# Patient Record
Sex: Male | Born: 1975 | Race: White | Hispanic: Yes | Smoking: Current some day smoker
Health system: Southern US, Community
[De-identification: ages and names within clinical notes are randomized; demographics above are authoritative.]

## PROBLEM LIST (undated history)

## (undated) HISTORY — PX: NASAL FRACTURE SURGERY: SHX718

---

## 2017-04-22 ENCOUNTER — Encounter: Payer: Self-pay | Admitting: Emergency Medicine

## 2017-04-22 ENCOUNTER — Ambulatory Visit (INDEPENDENT_AMBULATORY_CARE_PROVIDER_SITE_OTHER): Payer: Self-pay

## 2017-04-22 ENCOUNTER — Ambulatory Visit
Admission: EM | Admit: 2017-04-22 | Discharge: 2017-04-22 | Disposition: A | Discharge status: Home / Self Care | Payer: Self-pay | Attending: Family Medicine | Admitting: Family Medicine

## 2017-04-22 ENCOUNTER — Other Ambulatory Visit: Payer: Self-pay

## 2017-04-22 DIAGNOSIS — W19XXXA Unspecified fall, initial encounter: Secondary | ICD-10-CM

## 2017-04-22 DIAGNOSIS — S7002XA Contusion of left hip, initial encounter: Secondary | ICD-10-CM

## 2017-04-22 DIAGNOSIS — M25552 Pain in left hip: Secondary | ICD-10-CM

## 2017-04-22 DIAGNOSIS — S63502A Unspecified sprain of left wrist, initial encounter: Secondary | ICD-10-CM

## 2017-04-22 DIAGNOSIS — M25532 Pain in left wrist: Secondary | ICD-10-CM

## 2017-04-22 DIAGNOSIS — S60222A Contusion of left hand, initial encounter: Secondary | ICD-10-CM

## 2017-04-22 MED ORDER — TRAMADOL HCL 50 MG PO TABS: 50.0000 mg | ORAL_TABLET | Freq: Four times a day (QID) | ORAL | 0 refills | Status: DC | PRN
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
## 2020-02-10 ENCOUNTER — Other Ambulatory Visit: Payer: Self-pay

## 2020-02-10 ENCOUNTER — Ambulatory Visit (INDEPENDENT_AMBULATORY_CARE_PROVIDER_SITE_OTHER): Payer: Self-pay

## 2020-02-10 ENCOUNTER — Ambulatory Visit
Admission: EM | Admit: 2020-02-10 | Discharge: 2020-02-10 | Disposition: A | Discharge status: Home / Self Care | Payer: Self-pay | Attending: Emergency Medicine | Admitting: Emergency Medicine

## 2020-02-10 DIAGNOSIS — M7711 Lateral epicondylitis, right elbow: Secondary | ICD-10-CM

## 2020-02-10 DIAGNOSIS — M25521 Pain in right elbow: Secondary | ICD-10-CM

## 2020-02-10 MED ORDER — METHYLPREDNISOLONE 4 MG PO TBPK: ORAL_TABLET | Freq: Every day | ORAL | 0 refills | Status: DC

## 2020-02-10 MED ORDER — IBUPROFEN 600 MG PO TABS: 600.0000 mg | ORAL_TABLET | Freq: Four times a day (QID) | ORAL | 0 refills | Status: DC | PRN

## 2020-02-10 NOTE — ED Triage Notes (Signed)
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 Having trouble holding a pain can. Started months ago but now getting much worse.

## 2020-02-10 NOTE — ED Provider Notes (Signed)
HPI  SUBJECTIVE:  Anthony Frank is a right-handed 44 y.o. male who presents with right lateral elbow pain that has become constant and daily over the past 2 months.  States that he has been getting this pain intermittently for some time, but usually resolves with several days of rest and NSAIDs.  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  He reports intermittent swelling, numbness tingling in his hand and grip weakness secondary to the pain.  States that he has been working more over the past 2 weeks.  He tried 600 to 800 mg of ibuprofen with improvement in symptoms.  He is also tried icy hot with improvement.  He has tried rest, copper sleeve, Velcro elbow support.  He has also tried cold compresses with worsening in his symptoms.  Symptoms are also worse with holding objects, lifting, grabbing, straightening his arm fully.  Past medical history none for diabetes, hypertension, smoking.  PMD: None.   History reviewed. No pertinent past medical history.  Past Surgical History:  Procedure Laterality Date  . NASAL FRACTURE SURGERY      History reviewed. No pertinent family history.  Social History   Tobacco Use  . Smoking status: Current Some Day Smoker    Types: Pipe  . Smokeless tobacco: Never Used  Vaping Use  . Vaping Use: Never used  Substance Use Topics  . Alcohol use: No  . Drug use: No    No current facility-administered medications for this encounter.  Current Outpatient Medications:  .  ibuprofen (ADVIL) 600 MG tablet, Take 1 tablet (600 mg total) by mouth every 6 (six) hours as needed., Disp: 30 tablet, Rfl: 0 .  methylPREDNISolone (MEDROL DOSEPAK) 4 MG TBPK tablet, Take by mouth daily. Follow package instructions, Disp: 21 tablet, Rfl: 0  No Known Allergies   ROS  As noted in HPI.   Physical Exam  BP 133/89   Pulse 71   Temp 98.3 F (36.8 C) (Oral)   Resp 16   Ht 5\' 8"  (1.727 m)   Wt 72.6 kg   SpO2 100%   BMI 24.33 kg/m    Constitutional: Well developed, well nourished, no acute distress Eyes:  EOMI, conjunctiva normal bilaterally HENT: Normocephalic, atraumatic,mucus membranes moist Respiratory: Normal inspiratory effort Cardiovascular: Normal rate GI: nondistended skin: No rash, skin intact Musculoskeletal: R Elbow ROM Normal for Pt. pain with full extension., Supracondylar region NT , Radial head NT, Olecrenon process NT , Medial epicondyle NT , Lateral epicondyle tender, Shoulder NT, Wrist NT, Hand NT with distal NVI CR<2secs, radial pulse intact, Sensation LT and Motor intact distally in distribution of radial, median, and ulnar nerve function. Neurologic: Alert & oriented x 3, no focal neuro deficits Psychiatric: Speech and behavior appropriate   ED Course   Medications - No data to display  Orders Placed This Encounter  Procedures  . DG Elbow Complete Right    Standing Status:   Standing    Number of Occurrences:   1    Order Specific Question:   Reason for Exam (SYMPTOM  OR DIAGNOSIS REQUIRED)    Answer:   pain    No results found for this or any previous visit (from the past 24 hour(s)). DG Elbow Complete Right  Result Date: 02/10/2020 CLINICAL DATA:  Pain in right elbow lobe.  No known injury. EXAM: RIGHT ELBOW - COMPLETE 3+ VIEW COMPARISON:  None. FINDINGS: There is no evidence of fracture, dislocation, or joint effusion.  There is no evidence of arthropathy or other focal bone abnormality. Soft tissues are unremarkable. IMPRESSION: Negative. Electronically Signed   By: Signa Kell M.D.   On: 02/10/2020 11:30    ED Clinical Impression  1. Lateral epicondylitis of right elbow      ED Assessment/Plan  Reviewed imaging independently.  Normal elbow.  See radiology report for full details.  Patient with a lateral epicondylitis right elbow.  Was sent home with Tylenol/ibuprofen, Medrol Dosepak, rest for 1 to 2 weeks.  He is not working until January 10.  If not better in 2 weeks of  conservative treatment, he needs to follow-up with [facility].  Discussed imaging, MDM, treatment plan, and plan for follow-up with patient. patient agrees with plan.   Meds ordered this encounter  Medications  . ibuprofen (ADVIL) 600 MG tablet    Sig: Take 1 tablet (600 mg total) by mouth every 6 (six) hours as needed.    Dispense:  30 tablet    Refill:  0  . methylPREDNISolone (MEDROL DOSEPAK) 4 MG TBPK tablet    Sig: Take by mouth daily. Follow package instructions    Dispense:  21 tablet    Refill:  0    *This clinic note was created using Dragon dictation software. Therefore, there may be occasional mistakes despite careful proofreading.   ?    Domenick Gong, MD 02/10/20 1301

## 2020-02-10 NOTE — Discharge Instructions (Addendum)
Your x-ray was normal.  Take 600 mg of ibuprofen combined with 1000 mg of Tylenol together 3-4 times a day as needed for pain.  The Medrol Dosepak will help with pain and swelling.  Continue your compressive sleeves.  Try heating pad on the area.  Rest it is much as you can.  Follow-up with orthopedics if you are not better in 2 weeks

## 2021-10-05 IMAGING — CR DG ELBOW COMPLETE 3+V*R*
4 series · 4 of 4 positions shown · non-contrast
Comparison: None.

CLINICAL DATA: Pain in right elbow lobe.  No known injury.

EXAM:
RIGHT ELBOW - COMPLETE 3+ VIEW

[elbow ap]
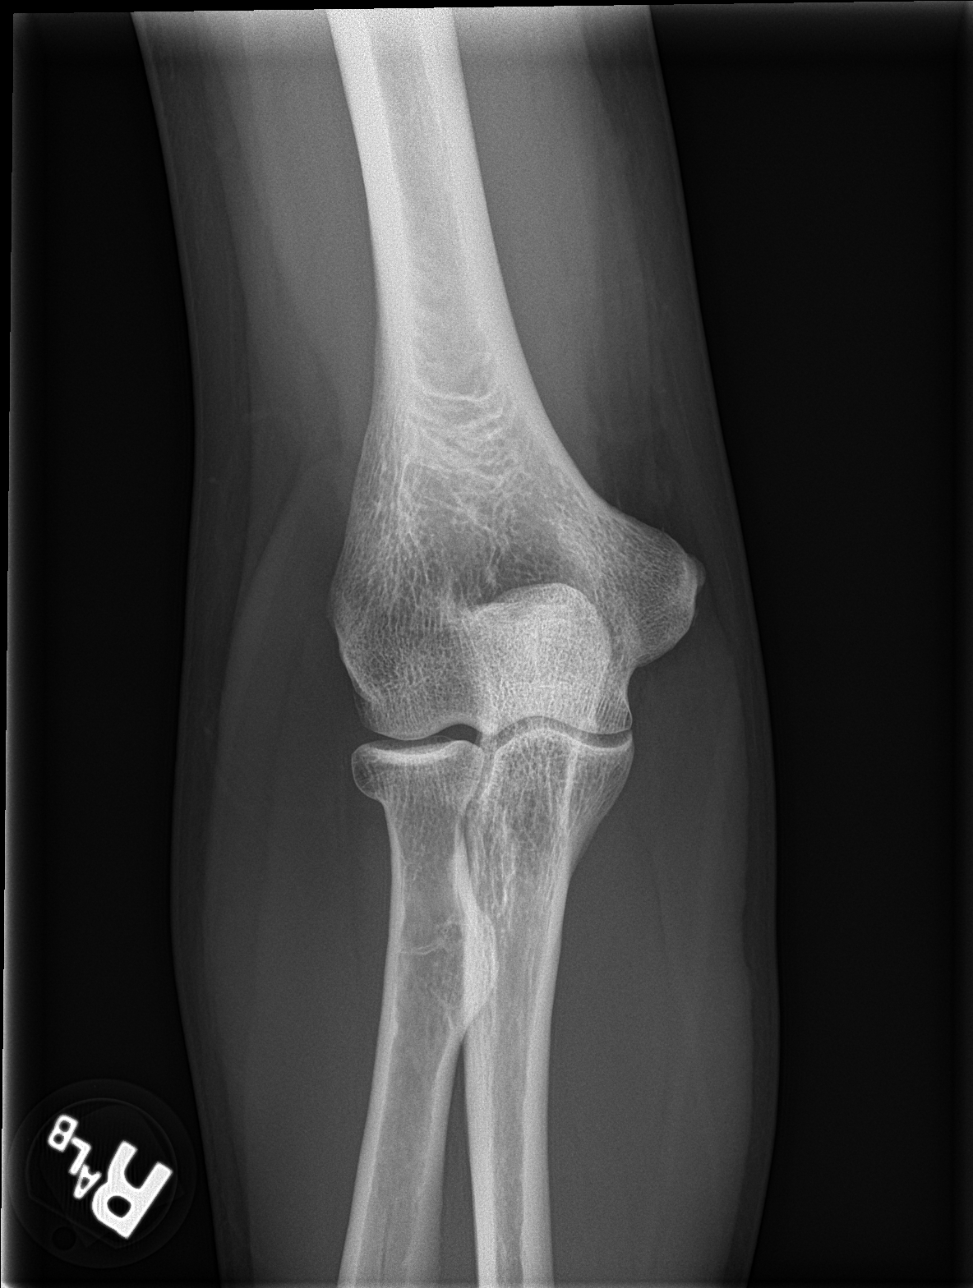

[elbow obl (1 of 2)]
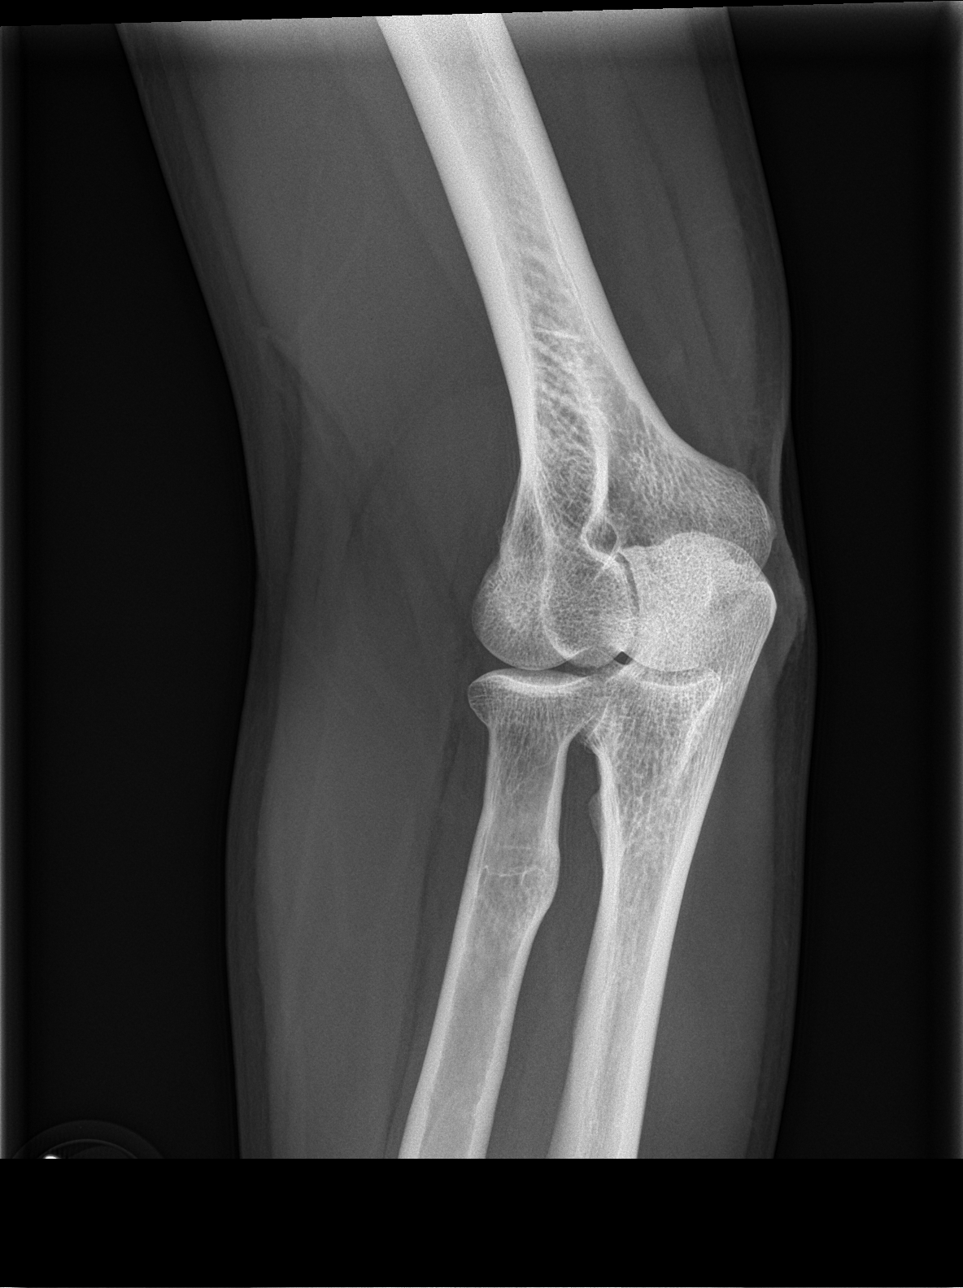

[elbow obl (2 of 2)]
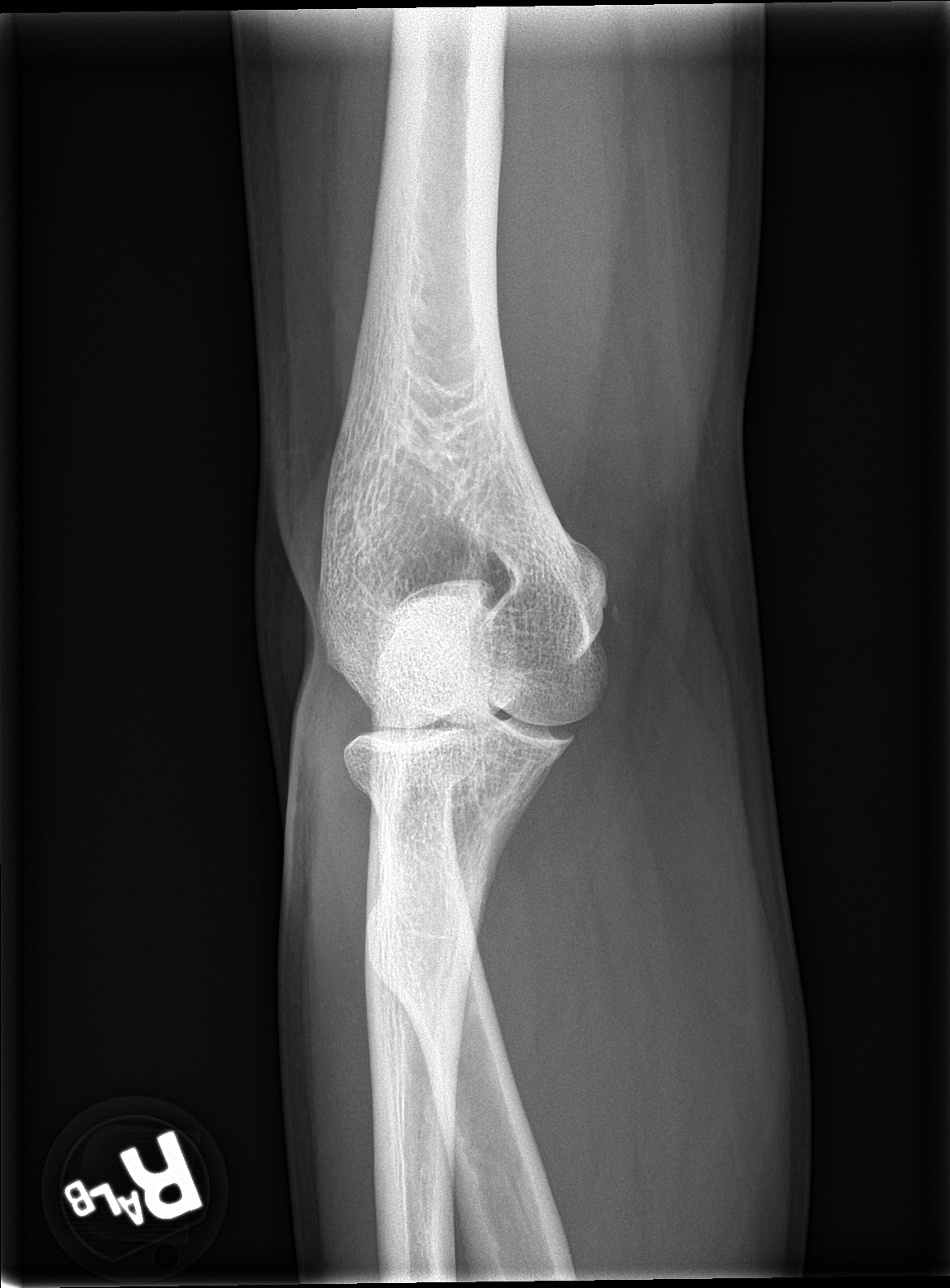

[elbow lat]
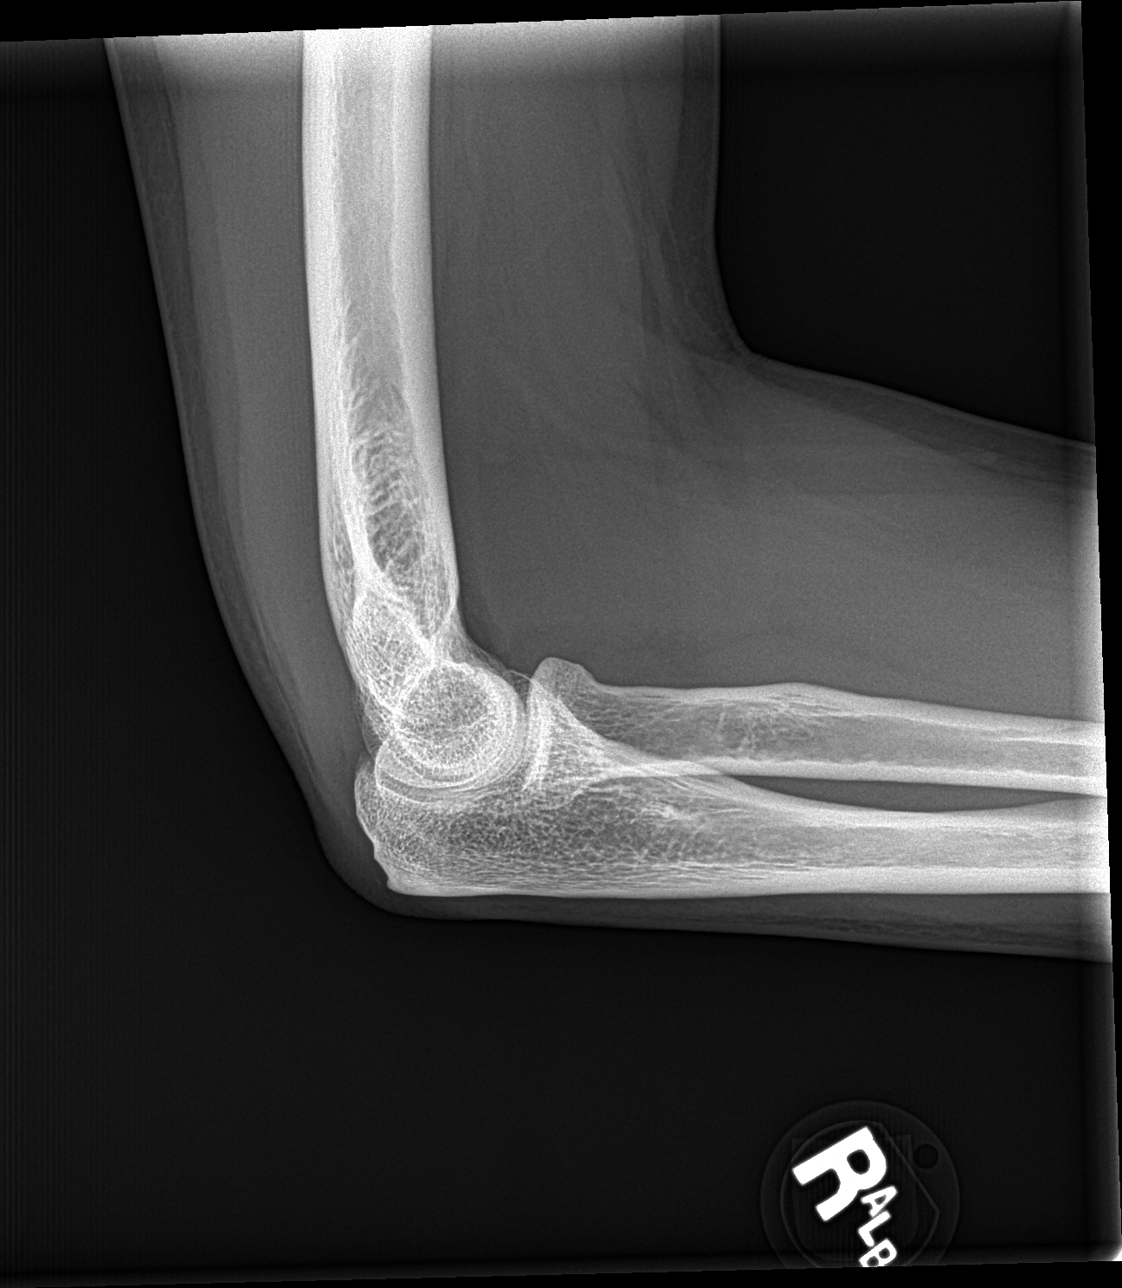

[4 of 4 positions shown; findings below may reference images not displayed]

FINDINGS: There is no evidence of fracture, dislocation, or joint effusion.
There is no evidence of arthropathy or other focal bone abnormality.
Soft tissues are unremarkable.
IMPRESSION: Negative.

## 2023-01-21 ENCOUNTER — Ambulatory Visit: Admission: EM | Admit: 2023-01-21 | Discharge: 2023-01-21 | Payer: Self-pay

## 2023-01-21 ENCOUNTER — Encounter: Payer: Self-pay | Admitting: Emergency Medicine

## 2023-01-21 DIAGNOSIS — F10939 Alcohol use, unspecified with withdrawal, unspecified: Secondary | ICD-10-CM

## 2023-01-21 DIAGNOSIS — F1090 Alcohol use, unspecified, uncomplicated: Secondary | ICD-10-CM

## 2023-01-21 NOTE — Discharge Instructions (Signed)
Go immediately to Er for further evaluation and treatment

## 2023-01-21 NOTE — ED Triage Notes (Addendum)
Pt has been drinking alcohol and not eating for several weeks. Today he feels shaky, has fatigue and dizziness.

## 2023-01-21 NOTE — ED Notes (Signed)
Patient is being discharged from the Urgent Care and sent to the Emergency Department via personal vehicle . Per Harl Bowie, NP, patient is in need of higher level of care due to alcohol withdrawal. Patient is aware and verbalizes understanding of plan of care.  Vitals:   01/21/23 1346  BP: 112/71  Pulse: (!) 110  Resp: (!) 22  Temp: 98.3 F (36.8 C)  SpO2: 100%

## 2023-01-21 NOTE — ED Provider Notes (Signed)
[facility]    CSN: [id number] Arrival date & time: 01/21/23  1317      History   Chief Complaint Chief Complaint  Patient presents with   Dizziness   Fatigue   excessive alcohol use     HPI Alejandro Ajello is a 47 y.o. male.   47 year old male, Whitney Reddinger, presents to urgent care for evaluation of alcohol problem, not eating for several weeks, feels shaky, fatigue, dizziness, depressed.  Patient denies homicidal or suicidal ideations. Pt is shaky, sweating, anxious,charge nurse Tiara at bedside.  The history is provided by the patient and the spouse.    History reviewed. No pertinent past medical history.  Patient Active Problem List   Diagnosis Date Noted   Alcohol withdrawal syndrome with complication (HCC) 01/21/2023   Alcohol drinking problem 01/21/2023    Past Surgical History:  Procedure Laterality Date   NASAL FRACTURE SURGERY         Home Medications    Prior to Admission medications   Medication Sig Start Date End Date Taking? Authorizing Provider  ibuprofen (ADVIL) 600 MG tablet Take 1 tablet (600 mg total) by mouth every 6 (six) hours as needed. 02/10/20   Domenick Gong, MD  methylPREDNISolone (MEDROL DOSEPAK) 4 MG TBPK tablet Take by mouth daily. Follow package instructions 02/10/20   Domenick Gong, MD    Family History No family history on file.  Social History Social History   Tobacco Use   Smoking status: Some Days    Types: Pipe   Smokeless tobacco: Never  Vaping Use   Vaping status: Every Day  Substance Use Topics   Alcohol use: Yes    Comment: Pt drinks beer and liquor   Drug use: No     Allergies   Patient has no known allergies.   Review of Systems Review of Systems  Constitutional:  Positive for appetite change and fatigue.  Neurological:  Positive for dizziness.  Psychiatric/Behavioral:  Negative for suicidal ideas. The patient is nervous/anxious.        Trying to quit drinking  alcohol  All other systems reviewed and are negative.    Physical Exam Triage Vital Signs ED Triage Vitals [01/21/23 1346]  Encounter Vitals Group     BP 112/71     Systolic BP Percentile      Diastolic BP Percentile      Pulse Rate (!) 110     Resp (!) 22     Temp 98.3 F (36.8 C)     Temp Source Oral     SpO2 100 %     Weight      Height      Head Circumference      Peak Flow      Pain Score      Pain Loc      Pain Education      Exclude from Growth Chart    No data found.  Updated Vital Signs BP 112/71 (BP Location: Left Arm)   Pulse (!) 110   Temp 98.3 F (36.8 C) (Oral)   Resp (!) 22   SpO2 100%   Visual Acuity Right Eye Distance:   Left Eye Distance:   Bilateral Distance:    Right Eye Near:   Left Eye Near:    Bilateral Near:     Physical Exam Vitals and nursing note reviewed.  Constitutional:      General: He is awake.     Appearance: He is ill-appearing.  HENT:     Head: Normocephalic.  Cardiovascular:     Rate and Rhythm: Tachycardia present.  Pulmonary:     Effort: Pulmonary effort is normal.  Neurological:     Mental Status: He is alert.  Psychiatric:        Behavior: Behavior is cooperative.      UC Treatments / Results  Labs (all labs ordered are listed, but only abnormal results are displayed) Labs Reviewed - No data to display  EKG   Radiology No results found.  Procedures Procedures (including critical care time)  Medications Ordered in UC Medications - No data to display  Initial Impression / Assessment and Plan / UC Course  I have reviewed the triage vital signs and the nursing notes.  Pertinent labs & imaging results that were available during my care of the patient were reviewed by me and considered in my medical decision making (see chart for details).  Discussed exam findings and plan of care with patient and significant other.  Referred patient to emergency room for further evaluation, spouse  will transport  patient.   Ddx: Alcohol abuse, DT's,depression Final Clinical Impressions(s) / UC Diagnoses   Final diagnoses:  Alcohol drinking problem     Discharge Instructions      Go immediately to Er for further evaluation and treatment    ED Prescriptions   None    PDMP not reviewed this encounter.   Clancy Gourd, NP 01/21/23 2008

## 2023-12-31 ENCOUNTER — Emergency Department: Payer: Self-pay

## 2023-12-31 ENCOUNTER — Inpatient Hospital Stay
Admission: EM | Admit: 2023-12-31 | Discharge: 2024-01-04 | DRG: 896 | Disposition: A | Discharge status: Home / Self Care | Payer: Self-pay | Attending: Emergency Medicine | Admitting: Emergency Medicine

## 2023-12-31 ENCOUNTER — Observation Stay: Payer: Self-pay

## 2023-12-31 DIAGNOSIS — N179 Acute kidney failure, unspecified: Secondary | ICD-10-CM | POA: Diagnosis present

## 2023-12-31 DIAGNOSIS — G928 Other toxic encephalopathy: Secondary | ICD-10-CM | POA: Diagnosis present

## 2023-12-31 DIAGNOSIS — F17290 Nicotine dependence, other tobacco product, uncomplicated: Secondary | ICD-10-CM | POA: Diagnosis present

## 2023-12-31 DIAGNOSIS — F419 Anxiety disorder, unspecified: Secondary | ICD-10-CM | POA: Diagnosis present

## 2023-12-31 DIAGNOSIS — D72829 Elevated white blood cell count, unspecified: Secondary | ICD-10-CM | POA: Diagnosis present

## 2023-12-31 DIAGNOSIS — R4182 Altered mental status, unspecified: Secondary | ICD-10-CM | POA: Diagnosis present

## 2023-12-31 DIAGNOSIS — J189 Pneumonia, unspecified organism: Secondary | ICD-10-CM

## 2023-12-31 DIAGNOSIS — R6510 Systemic inflammatory response syndrome (SIRS) of non-infectious origin without acute organ dysfunction: Secondary | ICD-10-CM | POA: Diagnosis present

## 2023-12-31 DIAGNOSIS — F10231 Alcohol dependence with withdrawal delirium: Principal | ICD-10-CM | POA: Diagnosis present

## 2023-12-31 DIAGNOSIS — F10931 Alcohol use, unspecified with withdrawal delirium: Secondary | ICD-10-CM | POA: Diagnosis present

## 2023-12-31 DIAGNOSIS — R569 Unspecified convulsions: Secondary | ICD-10-CM

## 2023-12-31 DIAGNOSIS — F10139 Alcohol abuse with withdrawal, unspecified: Secondary | ICD-10-CM

## 2023-12-31 DIAGNOSIS — F10939 Alcohol use, unspecified with withdrawal, unspecified: Secondary | ICD-10-CM

## 2023-12-31 DIAGNOSIS — R401 Stupor: Principal | ICD-10-CM

## 2023-12-31 DIAGNOSIS — Z79899 Other long term (current) drug therapy: Secondary | ICD-10-CM

## 2023-12-31 LAB — COMPREHENSIVE METABOLIC PANEL WITH GFR
ALT: 43 U/L (ref 0–44)
AST: 51 U/L — ABNORMAL HIGH (ref 15–41)
Albumin: 4.2 g/dL (ref 3.5–5.0)
Alkaline Phosphatase: 82 U/L (ref 38–126)
Anion gap: 16 — ABNORMAL HIGH (ref 5–15)
BUN: 13 mg/dL (ref 6–20)
CO2: 21 mmol/L — ABNORMAL LOW (ref 22–32)
Calcium: 8.2 mg/dL — ABNORMAL LOW (ref 8.9–10.3)
Chloride: 104 mmol/L (ref 98–111)
Creatinine, Ser: 1.59 mg/dL — ABNORMAL HIGH (ref 0.61–1.24)
GFR, Estimated: 53 mL/min — ABNORMAL LOW (ref >60)
Glucose, Bld: 135 mg/dL — ABNORMAL HIGH (ref 70–99)
Potassium: 3.8 mmol/L (ref 3.5–5.1)
Sodium: 141 mmol/L (ref 135–145)
Total Bilirubin: 0.4 mg/dL (ref 0.0–1.2)
Total Protein: 6.9 g/dL (ref 6.5–8.1)

## 2023-12-31 LAB — URINALYSIS, COMPLETE (UACMP) WITH MICROSCOPIC
Bilirubin Urine: NEGATIVE
Glucose, UA: NEGATIVE mg/dL
Hgb urine dipstick: NEGATIVE
Ketones, ur: 20 mg/dL — AB
Leukocytes,Ua: NEGATIVE
Nitrite: NEGATIVE
Protein, ur: NEGATIVE mg/dL
Specific Gravity, Urine: 1.021 (ref 1.005–1.030)
Squamous Epithelial / HPF: 0 /HPF (ref 0–5)
pH: 7 (ref 5.0–8.0)

## 2023-12-31 LAB — CBC
HCT: 48.7 % (ref 39.0–52.0)
Hemoglobin: 17.2 g/dL — ABNORMAL HIGH (ref 13.0–17.0)
MCH: 31.2 pg (ref 26.0–34.0)
MCHC: 35.3 g/dL (ref 30.0–36.0)
MCV: 88.2 fL (ref 80.0–100.0)
Platelets: 230 K/uL (ref 150–400)
RBC: 5.52 MIL/uL (ref 4.22–5.81)
RDW: 14.6 % (ref 11.5–15.5)
WBC: 15.4 K/uL — ABNORMAL HIGH (ref 4.0–10.5)
nRBC: 0 % (ref 0.0–0.2)

## 2023-12-31 LAB — VITAMIN B12: Vitamin B-12: 1040 pg/mL — ABNORMAL HIGH (ref 180–914)

## 2023-12-31 LAB — HIV ANTIBODY (ROUTINE TESTING W REFLEX): HIV Screen 4th Generation wRfx: NONREACTIVE

## 2023-12-31 LAB — LIPASE, BLOOD: Lipase: 115 U/L — ABNORMAL HIGH (ref 11–51)

## 2023-12-31 LAB — URINE DRUG SCREEN
Amphetamines: NEGATIVE
Barbiturates: NEGATIVE
Benzodiazepines: POSITIVE — AB
Cocaine: NEGATIVE
Fentanyl: NEGATIVE
Methadone Scn, Ur: NEGATIVE
Opiates: NEGATIVE
Tetrahydrocannabinol: NEGATIVE

## 2023-12-31 LAB — T4, FREE: Free T4: 1.1 ng/dL (ref 0.61–1.12)

## 2023-12-31 LAB — ETHANOL: Alcohol, Ethyl (B): <15 mg/dL (ref <15)

## 2023-12-31 LAB — TSH: TSH: 0.27 u[IU]/mL — ABNORMAL LOW (ref 0.350–4.500)

## 2023-12-31 MED ORDER — LORAZEPAM 2 MG PO TABS
0.0000 mg | ORAL_TABLET | Freq: Four times a day (QID) | ORAL | Status: AC
  Administered 2024-01-01: 2 mg via ORAL
  Administered 2024-01-01: 1 mg via ORAL
  Filled 2023-12-31 (×2): qty 1

## 2023-12-31 MED ORDER — THIAMINE HCL 100 MG/ML IJ SOLN
500.0000 mg | Freq: Three times a day (TID) | INTRAVENOUS | Status: DC
  Filled 2023-12-31 (×2): qty 5

## 2023-12-31 MED ORDER — LORAZEPAM 2 MG/ML IJ SOLN: 0.0000 mg | Freq: Two times a day (BID) | INTRAMUSCULAR | Status: AC

## 2023-12-31 MED ORDER — LORAZEPAM 2 MG/ML IJ SOLN
2.0000 mg | Freq: Four times a day (QID) | INTRAMUSCULAR | Status: DC | PRN
  Filled 2023-12-31: qty 1

## 2023-12-31 MED ORDER — ADULT MULTIVITAMIN W/MINERALS CH
1.0000 | ORAL_TABLET | Freq: Every day | ORAL | Status: DC
  Administered 2023-12-31 – 2024-01-04 (×5): 1 via ORAL
  Filled 2023-12-31 (×5): qty 1

## 2023-12-31 MED ORDER — ACETAMINOPHEN 325 MG PO TABS
650.0000 mg | ORAL_TABLET | Freq: Four times a day (QID) | ORAL | Status: DC | PRN
  Filled 2023-12-31: qty 2

## 2023-12-31 MED ORDER — THIAMINE HCL 100 MG/ML IJ SOLN: 100.0000 mg | Freq: Every day | INTRAMUSCULAR | Status: DC

## 2023-12-31 MED ORDER — THIAMINE HCL 100 MG/ML IJ SOLN
500.0000 mg | Freq: Three times a day (TID) | INTRAVENOUS | Status: AC
  Administered 2023-12-31 – 2024-01-02 (×6): 500 mg via INTRAVENOUS
  Filled 2023-12-31 (×6): qty 5

## 2023-12-31 MED ORDER — LORAZEPAM 2 MG/ML IJ SOLN
0.0000 mg | Freq: Four times a day (QID) | INTRAMUSCULAR | Status: AC
  Administered 2023-12-31 (×3): 2 mg via INTRAVENOUS
  Administered 2024-01-01: 1 mg via INTRAVENOUS
  Administered 2024-01-01: 2 mg via INTRAVENOUS
  Administered 2024-01-01: 1 mg via INTRAVENOUS
  Filled 2023-12-31 (×6): qty 1

## 2023-12-31 MED ORDER — ONDANSETRON HCL 4 MG/2ML IJ SOLN: 4.0000 mg | Freq: Four times a day (QID) | INTRAMUSCULAR | Status: DC | PRN

## 2023-12-31 MED ORDER — FOLIC ACID 1 MG PO TABS
1.0000 mg | ORAL_TABLET | Freq: Every day | ORAL | Status: DC
  Administered 2023-12-31 – 2024-01-04 (×5): 1 mg via ORAL
  Filled 2023-12-31 (×5): qty 1

## 2023-12-31 MED ORDER — LORAZEPAM 2 MG/ML IJ SOLN
2.0000 mg | Freq: Once | INTRAMUSCULAR | Status: AC
Start: 2023-12-31 — End: 2024-01-03
  Administered 2024-01-03: 2 mg via INTRAVENOUS
  Filled 2023-12-31: qty 1

## 2023-12-31 MED ORDER — THIAMINE HCL 100 MG/ML IJ SOLN
250.0000 mg | Freq: Every day | INTRAVENOUS | Status: DC
  Administered 2024-01-02 – 2024-01-04 (×3): 250 mg via INTRAVENOUS
  Filled 2023-12-31 (×3): qty 2.5

## 2023-12-31 MED ORDER — LORAZEPAM 2 MG PO TABS
0.0000 mg | ORAL_TABLET | Freq: Two times a day (BID) | ORAL | Status: AC
  Administered 2024-01-02: 1 mg via ORAL
  Filled 2023-12-31 (×2): qty 1

## 2023-12-31 MED ORDER — SODIUM CHLORIDE 0.9 % IV BOLUS
1000.0000 mL | Freq: Once | INTRAVENOUS | Status: AC
  Administered 2023-12-31: 1000 mL via INTRAVENOUS

## 2023-12-31 MED ORDER — ENOXAPARIN SODIUM 40 MG/0.4ML IJ SOSY
40.0000 mg | PREFILLED_SYRINGE | INTRAMUSCULAR | Status: DC
  Administered 2023-12-31 – 2024-01-03 (×4): 40 mg via SUBCUTANEOUS
  Filled 2023-12-31 (×4): qty 0.4

## 2023-12-31 MED ORDER — THIAMINE MONONITRATE 100 MG PO TABS: 100.0000 mg | ORAL_TABLET | Freq: Every day | ORAL | Status: DC

## 2023-12-31 NOTE — ED Provider Notes (Addendum)
 [facility] Provider Note    Event Date/Time   First MD Initiated Contact with Patient 12/31/23 0403     (approximate)   History   Chief Complaint: Altered Mental Status   HPI  Anthony Frank is a 48 y.o. male with history of alcohol dependence who is brought to the ED by EMS due to altered mental status.  He is somnolent on arrival.  Not able to engage with questioning in English or with assistance from Spanish interpreter.  Denies pain.    Family (wife and cousin) at bedside notes that patient has been in this state for the past 3 days, waxing and waning.  They suspect it is due to relapse of alcohol abuse.  Patient has a history of alcohol dependence, has been in remission for a while, and then following a vacation to [place] where the patient is from, he started drinking heavily again.  Family members note that they suspect he sneaks alcohol frequently in between work and coming home.  They have found alcohol packages in his car.        No past medical history on file.  Current Outpatient Rx   Order #: [id number] Class: Normal   Order #: [id number] Class: Normal    Past Surgical History:  Procedure Laterality Date   NASAL FRACTURE SURGERY      Physical Exam   Triage Vital Signs: ED Triage Vitals  Encounter Vitals Group     BP      Girls Systolic BP Percentile      Girls Diastolic BP Percentile      Boys Systolic BP Percentile      Boys Diastolic BP Percentile      Pulse      Resp      Temp      Temp src      SpO2      Weight      Height      Head Circumference      Peak Flow      Pain Score      Pain Loc      Pain Education      Exclude from Growth Chart     Most recent vital signs: Vitals:   12/31/23 0419 12/31/23 0430  BP:  137/78  Pulse:  (!) 106  Resp:  14  Temp: 98.3 F (36.8 C)   SpO2:  98%    General: Awake, no distress.  Stuporous and somnolent.  Yawning. CV:  Good peripheral perfusion.  Tachycardia  heart rate 105.  Normal distal pulses.  Regular rhythm. Resp:  Normal effort..  Auscultation bilaterally Abd:  No distention.  Soft nontender Other:  No pallor or icterus.  No signs of head trauma or other injuries.   ED Results / Procedures / Treatments   Labs (all labs ordered are listed, but only abnormal results are displayed) Labs Reviewed  COMPREHENSIVE METABOLIC PANEL WITH GFR - Abnormal; Notable for the following components:      Result Value   CO2 21 (*)    Glucose, Bld 135 (*)    Creatinine, Ser 1.59 (*)    Calcium 8.2 (*)    AST 51 (*)    GFR, Estimated 53 (*)    Anion gap 16 (*)    All other components within normal limits  CBC - Abnormal; Notable for the following components:   WBC 15.4 (*)    Hemoglobin 17.2 (*)    All other components within  normal limits  LIPASE, BLOOD - Abnormal; Notable for the following components:   Lipase 115 (*)    All other components within normal limits  ETHANOL  URINALYSIS, ROUTINE W REFLEX MICROSCOPIC  URINE DRUG SCREEN  CBG MONITORING, ED     EKG Interpreted by me Sinus tachycardia rate 110.  Right axis, normal intervals.  Normal QRS ST segments and T waves.   RADIOLOGY CT head interpreted by me, unremarkable.  Radiology report reviewed.   PROCEDURES:  .Critical Care  Performed by: Viviann Pastor, MD Authorized by: Viviann Pastor, MD   Critical care provider statement:    Critical care was necessary to treat or prevent imminent or life-threatening deterioration of the following conditions:  CNS failure or compromise    MEDICATIONS ORDERED IN ED: Medications  LORazepam (ATIVAN) injection 0-4 mg (has no administration in time range)    Or  LORazepam (ATIVAN) tablet 0-4 mg (has no administration in time range)  LORazepam (ATIVAN) injection 0-4 mg (has no administration in time range)    Or  LORazepam (ATIVAN) tablet 0-4 mg (has no administration in time range)  thiamine (VITAMIN B1) tablet 100 mg (has no  administration in time range)    Or  thiamine (VITAMIN B1) injection 100 mg (has no administration in time range)  LORazepam (ATIVAN) injection 2 mg (has no administration in time range)  sodium chloride 0.9 % bolus 1,000 mL (1,000 mLs Intravenous New Bag/Given 12/31/23 0445)     IMPRESSION / MDM / ASSESSMENT AND PLAN / ED COURSE  I reviewed the triage vital signs and the nursing notes.  DDx: Alcohol intoxication, electrolyte derangement, metabolic acidosis, AKI, intracranial hemorrhage  Patient's presentation is most consistent with acute presentation with potential threat to life or bodily function.  Patient presents with altered mental status, stuporous and somnolent.  Appears somewhat dehydrated, and has sinus tachycardia with otherwise normal vital signs.  No signs of trauma.  No focal exam findings.  No focal neurologic deficits to suggest stroke, other than aphasia.  Symptoms have been going on for a few days, outside of any interventional window.  ----------------------------------------- 6:10 AM on 12/31/2023 ----------------------------------------- Labs showed leukocytosis, mild metabolic acidosis with anion gap elevation.  Lipase of 115.  Alcohol level negative.  CT head unremarkable.   Persistent tachycardia with heart rate of 110 despite IV fluids.  Also has facial flushing.  Will need to admit for altered mental status.  Will start CIWA protocol and IV Ativan for possible complicated alcohol withdrawal.       FINAL CLINICAL IMPRESSION(S) / ED DIAGNOSES   Final diagnoses:  Stupor  Alcohol withdrawal syndrome with complication (HCC)     Rx / DC Orders   ED Discharge Orders     None        Note:  This document was prepared using Dragon voice recognition software and may include unintentional dictation errors.   Viviann Pastor, MD 12/31/23 9382    Viviann Pastor, MD 12/31/23 9380    Viviann Pastor, MD 12/31/23 [phone]

## 2023-12-31 NOTE — ED Notes (Addendum)
 Patient placed on 2 L [place] due to O2 saturation dropping to mid 80's.

## 2023-12-31 NOTE — ED Notes (Signed)
 Patient transported to MRI

## 2023-12-31 NOTE — Procedures (Signed)
 Patient Name: Anthony Frank  MRN: [id number]  Epilepsy Attending: Arlin MALVA Krebs  Referring Physician/Provider: Laurita Cort DASEN, MD  Date: 12/31/2023 Duration: 26.22 mins  Patient history: 48yo M with ams. EEG to evaluate for seizure  Level of alertness: Awake, asleep  AEDs during EEG study: Ativan  Technical aspects: This EEG study was done with scalp electrodes positioned according to the 10-20 International system of electrode placement. Electrical activity was reviewed with band pass filter of 1-70Hz , sensitivity of 7 uV/mm, display speed of 42mm/sec with a 60Hz  notched filter applied as appropriate. EEG data were recorded continuously and digitally stored.  Video monitoring was available and reviewed as appropriate.  Description: The posterior dominant rhythm consists of 9-10 Hz activity of moderate voltage (25-35 uV) seen predominantly in posterior head regions, symmetric and reactive to eye opening and eye closing. Sleep was characterized by vertex waves, sleep spindles (12 to 14 Hz), maximal frontocentral region. There is an excessive amount of 15 to 18 Hz beta activity distributed symmetrically and diffusely. EEG showed intermittent generalized 3 to 6 Hz theta-delta slowing. Hyperventilation and photic stimulation were not performed.     ABNORMALITY - Intermittent slow, generalized - Excessive beta, generalized  IMPRESSION: This study is suggestive of mild generalized cerebral dysfunction (encephalopathy). The excessive beta activity seen in the background is most likely due to the effect of benzodiazepine and is a benign EEG pattern. No seizures or epileptiform discharges were seen throughout the recording.  Jamita Mckelvin O Thelia Tanksley  ]

## 2023-12-31 NOTE — Progress Notes (Signed)
 Eeg done

## 2023-12-31 NOTE — H&P (Signed)
 History and Physical    Sunny Gains FMW:[id number] DOB: 16-Jul-1975 DOA: 12/31/2023  PCP: Pcp, No (Confirm with patient/family/NH records and if not entered, this has to be entered at [facility] point of entry) Patient coming from: Home  I have personally briefly reviewed patient's old medical records in [facility]  Chief Complaint: Patient is confused.  HPI: Anthony Frank is a 48 y.o. male with medical history significant of alcohol abuse, brought in by family member for evaluation of altered mentations.  Family at bedside gave history.  Patient is unknown binge drinker, he traveled to [place] for the last week and came back on Friday.  On same day, wife started noticed patient  must be drunk as his gait is unsteady.  In the next 2 days, wife was at work and did not attend for him.  By Monday, patient was found to be more confused and unsteady, and complaining about feeling nausea.  Yesterday, mentation now improving, and patient became more sleepy and more confused.  Family became more concerned and brought him to ED last night.  Patient complained about headache, and feeling nausea but denied any weakness numbness of any of the limbs.  No neck pain, no fever or chills.  No urinary symptoms or diarrhea. ED Course: Afebrile, tachycardia heart rate 100-1 20s, blood pressure 130/70 O2 saturation 100% room air.  Blood work showed negative alcohol level, WBC 15.1 hemoglobin 17.2, BUN 13 creatinine 1.5.  Patient was given 1 dose of 2 mg of Ativan and became obtunded  Review of Systems: Unable to perform, patient is confused.  No past medical history on file.  Past Surgical History:  Procedure Laterality Date   NASAL FRACTURE SURGERY       reports that he has been smoking pipe. He has never used smokeless tobacco. He reports current alcohol use. He reports that he does not use drugs.  No Known Allergies  No family history on file.   Prior to Admission medications    Not on File    Physical Exam: Vitals:   12/31/23 0835 12/31/23 0900 12/31/23 0930 12/31/23 1000  BP:  122/65 129/77 131/77  Pulse:  (!) 101 (!) 109 (!) 122  Resp:  18 19 20   Temp: 98.6 F (37 C)     TempSrc: Oral     SpO2:  98% 99% 100%  Weight:  71 kg    Height:  5' 9 (1.753 m)      Constitutional: NAD, calm, comfortable Vitals:   12/31/23 0835 12/31/23 0900 12/31/23 0930 12/31/23 1000  BP:  122/65 129/77 131/77  Pulse:  (!) 101 (!) 109 (!) 122  Resp:  18 19 20   Temp: 98.6 F (37 C)     TempSrc: Oral     SpO2:  98% 99% 100%  Weight:  71 kg    Height:  5' 9 (1.753 m)     Eyes: PERRL, lids and conjunctivae normal ENMT: Mucous membranes are moist. Posterior pharynx clear of any exudate or lesions.Normal dentition.  Neck: normal, supple, no masses, no thyromegaly Respiratory: clear to auscultation bilaterally, no wheezing, no crackles. Normal respiratory effort. No accessory muscle use.  Cardiovascular: Regular rate and rhythm, no murmurs / rubs / gallops. No extremity edema. 2+ pedal pulses. No carotid bruits.  Abdomen: no tenderness, no masses palpated. No hepatosplenomegaly. Bowel sounds positive.  Musculoskeletal: no clubbing / cyanosis. No joint deformity upper and lower extremities. Good ROM, no contractures. Normal muscle tone.  Skin: no  rashes, lesions, ulcers. No induration Neurologic: CN 2-12 grossly intact. Sensation intact, DTR normal. Strength 5/5 in all 4.  Psychiatric: Arousable, confused.  Labs on Admission: I have personally reviewed following labs and imaging studies  CBC: Recent Labs  Lab 12/31/23 0420  WBC 15.4*  HGB 17.2*  HCT 48.7  MCV 88.2  PLT 230   Basic Metabolic Panel: Recent Labs  Lab 12/31/23 0420  NA 141  K 3.8  CL 104  CO2 21*  GLUCOSE 135*  BUN 13  CREATININE 1.59*  CALCIUM 8.2*   GFR: Estimated Creatinine Clearance: 56.8 mL/min (A) (by C-G formula based on SCr of 1.59 mg/dL (H)). Liver Function Tests: Recent Labs   Lab 12/31/23 0420  AST 51*  ALT 43  ALKPHOS 82  BILITOT 0.4  PROT 6.9  ALBUMIN 4.2   Recent Labs  Lab 12/31/23 0420  LIPASE 115*   No results for input(s): AMMONIA in the last 168 hours. Coagulation Profile: No results for input(s): INR, PROTIME in the last 168 hours. Cardiac Enzymes: No results for input(s): CKTOTAL, CKMB, CKMBINDEX, TROPONINI in the last 168 hours. BNP (last 3 results) No results for input(s): PROBNP in the last 8760 hours. HbA1C: No results for input(s): HGBA1C in the last 72 hours. CBG: No results for input(s): GLUCAP in the last 168 hours. Lipid Profile: No results for input(s): CHOL, HDL, LDLCALC, TRIG, CHOLHDL, LDLDIRECT in the last 72 hours. Thyroid Function Tests: Recent Labs    12/31/23 0420  TSH 0.270*   Anemia Panel: No results for input(s): VITAMINB12, FOLATE, FERRITIN, TIBC, IRON, RETICCTPCT in the last 72 hours. Urine analysis: No results found for: COLORURINE, APPEARANCEUR, LABSPEC, PHURINE, GLUCOSEU, HGBUR, BILIRUBINUR, KETONESUR, PROTEINUR, UROBILINOGEN, NITRITE, LEUKOCYTESUR  Radiological Exams on Admission: DG Chest 1 View Result Date: 12/31/2023 CLINICAL DATA:  Altered mental status. EXAM: CHEST  1 VIEW COMPARISON:  None. FINDINGS: Lungs are hypoinflated without lobar consolidation or effusion. Triangular opacity over the right lower lung which may be artifactual. Cardiomediastinal silhouette and remainder of the exam is unremarkable. IMPRESSION: 1. Hypoinflation without acute cardiopulmonary disease. 2. Triangular opacity over the right lower lung which may be artifactual. Recommend repeat PA and lateral chest x-ray. Electronically Signed   By: Toribio Agreste M.D.   On: 12/31/2023 09:36   CT HEAD WO CONTRAST Result Date: 12/31/2023 CLINICAL DATA:  Altered mental status. EXAM: CT HEAD WITHOUT CONTRAST TECHNIQUE: Contiguous axial images were obtained from the base of  the skull through the vertex without intravenous contrast. RADIATION DOSE REDUCTION: This exam was performed according to the departmental dose-optimization program which includes automated exposure control, adjustment of the mA and/or kV according to patient size and/or use of iterative reconstruction technique. COMPARISON:  No comparison studies available. FINDINGS: Brain: There is no evidence for acute hemorrhage, hydrocephalus, mass lesion, or abnormal extra-axial fluid collection. No definite CT evidence for acute infarction. Vascular: No hyperdense vessel or unexpected calcification. Skull: No evidence for fracture. No worrisome lytic or sclerotic lesion. Sinuses/Orbits: The visualized paranasal sinuses and mastoid air cells are clear. Visualized portions of the globes and intraorbital fat are unremarkable. Other: None. IMPRESSION: No acute intracranial abnormality. Electronically Signed   By: Camellia Candle M.D.   On: 12/31/2023 05:50    EKG: Independently reviewed.  Sinus tachycardia, no acute ST changes.  Assessment/Plan Principal Problem:   Acute hyperactive alcohol withdrawal delirium (HCC) Active Problems:   AMS (altered mental status)  (please populate well all problems here in Problem List. (For example, if patient  is on BP meds at home and you resume or decide to hold them, it is a problem that needs to be her. Same for CAD, COPD, HLD and so on)  Acute metabolic encephalopathy Alcohol withdrawal - Case was discussed with neurology at bedside. - Continue to look for metabolic etiology, agreed with checking B12, ammonia, TSH level - EEG and brain MRI - Agreed with neurology about high-dose thiamine for possible Warnicke encephalopathy -CIWA  SIRS - Leukocytosis and tachycardia, no significant infection source found.  Clinically patient does not have any meningeal sign, low suspicion for CNS infection.   - Check UA and chest x-ray, monitor off antibiotics.  DVT prophylaxis:  Lovenox Code Status: Full code Family Communication: Wife at bedside Disposition Plan: Expect less than 2 midnight hospital stay Consults called: Neurology Admission status: PCU observation   Cort ONEIDA Mana MD [facility] Pager [phone]  12/31/2023, 10:57 AM

## 2023-12-31 NOTE — Consult Note (Signed)
 NEUROLOGY CONSULT NOTE   Date of service: December 31, 2023 Patient Name: Anthony Frank MRN:  [id number] DOB:  08-Jan-1976 Chief Complaint: Altered mental status Requesting Provider: Laurita Cort DASEN, MD  History of Present Illness  Anthony Frank is a 48 y.o. male with significant past history other than alcohol abuse, who recently came back from a trip to [place], and according to wife has had excessive alcohol consumption during that trip and also upon returning.  He consumes alcohol in his car to hide from his wife because she does not approve of it.  She noticed that he was very sleepy on Saturday and was not feeling well.  On Sunday he woke up and did some work but over the last day or so has been more drowsy and not acting like himself.  According to her, he probably has not had much to drink in the last couple of days but she is not sure when his last drink was. Patient was admitted to the hospitalist for further evaluation.  According to the hospitalist this morning, when he examined the patient, the patient was extremely drowsy.  He had received IV Ativan. The hospitalist was concerned that the patient has an underlying neurological process for which he consulted me. I saw and evaluated the patient with the hospitalist, with the wife at bedside provide history. Patient was able to tell me that he did not have any fevers or sickness while he was traveling to [place]. Wife also does not report any febrile illness Noted to have AKI in the ER. Also has leukocytosis.   ROS  Unable to reliably ascertain due to his altered mentation  Past History  No past medical history on file.  Past Surgical History:  Procedure Laterality Date   NASAL FRACTURE SURGERY      Family History: No family history on file.  Social History  reports that he has been smoking pipe. He has never used smokeless tobacco. He reports current alcohol use. He reports that he does not use  drugs.  No Known Allergies  Medications   Current Facility-Administered Medications:    acetaminophen (TYLENOL) tablet 650 mg, 650 mg, Oral, Q6H PRN, Zhang, Ping T, MD   enoxaparin (LOVENOX) injection 40 mg, 40 mg, Subcutaneous, Q24H, Zhang, Ping T, MD   LORazepam (ATIVAN) injection 0-4 mg, 0-4 mg, Intravenous, Q6H, 2 mg at 12/31/23 0703 **OR** LORazepam (ATIVAN) tablet 0-4 mg, 0-4 mg, Oral, Q6H, Viviann Pastor, MD   NOREEN ON 01/02/2024] LORazepam (ATIVAN) injection 0-4 mg, 0-4 mg, Intravenous, Q12H **OR** [START ON 01/02/2024] LORazepam (ATIVAN) tablet 0-4 mg, 0-4 mg, Oral, Q12H, Viviann Pastor, MD   LORazepam (ATIVAN) injection 2 mg, 2 mg, Intravenous, Once, Viviann Pastor, MD   LORazepam (ATIVAN) injection 2 mg, 2 mg, Intravenous, Q6H PRN, Zhang, Ping T, MD   ondansetron (ZOFRAN) injection 4 mg, 4 mg, Intravenous, Q6H PRN, Zhang, Ping T, MD   thiamine (VITAMIN B1) 500 mg in sodium chloride 0.9 % 50 mL IVPB, 500 mg, Intravenous, TID, Laurita Cort DASEN, MD No current outpatient medications on file.  Vitals   Vitals:   12/31/23 0705 12/31/23 0830 12/31/23 0835 12/31/23 0900  BP: 115/64 (!) 118/59    Pulse: (!) 112 (!) 102    Resp:      Temp:   98.6 F (37 C)   TempSrc:   Oral   SpO2:      Weight:    71 kg  Height:    5' 9 (1.753  m)    Body mass index is 23.11 kg/m.   Physical Exam   General: Drowsy, sleeping in bed, awakens to voice.  Appears flushed HEENT: Normocephalic atraumatic CVS: Regular rate rhythm Abdomen nondistended nontender Neurological exam The patient is very drowsy He awakens to voice He follows commands but inconsistently. He is not able to name simple objects He is not able to repeat He has extremely poor attention concentration Cranial nerves II to XII appear intact Motor examination reveals no strength deficits Sensation exam reveals strong withdrawal to noxious simulation in all fours and he also is able to say that he feels my touch and  noxious stimulation to the appropriate extent. Coordination examination is difficult to assess because he does not follow commands consistently No neck stiffness.  Negative Kernig's.  Negative Brudzinski's.  Labs/Imaging/Neurodiagnostic studies   CBC:  Recent Labs  Lab Jan 20, 2024 0420  WBC 15.4*  HGB 17.2*  HCT 48.7  MCV 88.2  PLT 230   Basic Metabolic Panel:  Lab Results  Component Value Date   NA 141 01-20-24   K 3.8 2024/01/20   CO2 21 (L) January 20, 2024   GLUCOSE 135 (H) Jan 20, 2024   BUN 13 January 20, 2024   CREATININE 1.59 (H) 2024/01/20   CALCIUM 8.2 (L) Jan 20, 2024   GFRNONAA 53 (L) January 20, 2024  Lipase 115, AST 51, ALT 43.    Component Value Date/Time   [facility] <15 2024/01/20 0420    Imaging: Noncontrasted head CT reviewed personally-no acute findings. Chest x-ray-hypoinflation without acute cardiopulmonary disease.  Triangular opacity over the right lower lung which may be artifactual-recommend repeat PA and lateral chest x-ray  ASSESSMENT   Anthony Frank is a 48 y.o. male with no significant past medical history except that for alcohol abuse, presenting for evaluation of altered mental status that has been waxing and waning for the past few days. On examination he is encephalopathic without any focal findings. There is no clear-cut meningitic signs on his exam. He has leukocytosis-WBC count 15.4.  He also has an AKI with creatinine of 1.59-unclear what his baseline is. Also has elevation of lipase and AST. Given his long alcohol binge, altered mental status might be related to toxic encephalopathy from alcohol abuse although his alcohol levels were not detectable.  He might be in withdrawal as well. The AKI might also be contributing to the altered mentation. Chest x-ray although not impressive, may have a component of pneumonia  Impression: Evaluate for toxic metabolic encephalopathy, evaluate for pneumonia   RECOMMENDATIONS  I would recommend thiamine  Wernicke's protocol Urinary toxicology screen B12 levels Ammonia levels TSH Check urinalysis Check chest x-ray PA lateral or consider CT chest CIWA protocol MRI brain without contrast Plan discussed with Dr. Laurita and patient's wife at bedside ______________________________________________________________________    Signed, Eligio Lav, MD [facility]

## 2023-12-31 NOTE — ED Notes (Signed)
 Called CCMD to initiate cardiac monitoring.

## 2024-01-01 ENCOUNTER — Inpatient Hospital Stay: Payer: Self-pay

## 2024-01-01 LAB — RESPIRATORY PANEL BY PCR

## 2024-01-01 LAB — AMMONIA: Ammonia: 29 umol/L (ref 9–35)

## 2024-01-01 LAB — LIPASE, BLOOD: Lipase: 96 U/L — ABNORMAL HIGH (ref 11–51)

## 2024-01-01 MED ORDER — BUTALBITAL-APAP-CAFFEINE 50-325-40 MG PO TABS
1.0000 | ORAL_TABLET | Freq: Four times a day (QID) | ORAL | Status: DC | PRN
  Administered 2024-01-01 – 2024-01-04 (×6): 1 via ORAL
  Filled 2024-01-01 (×6): qty 1

## 2024-01-01 MED ORDER — HYDROCODONE-ACETAMINOPHEN 5-325 MG PO TABS
1.0000 | ORAL_TABLET | Freq: Four times a day (QID) | ORAL | Status: DC | PRN
  Administered 2024-01-01 – 2024-01-03 (×3): 1 via ORAL
  Filled 2024-01-01 (×4): qty 1

## 2024-01-01 MED ORDER — PHENOBARBITAL 32.4 MG PO TABS
64.8000 mg | ORAL_TABLET | Freq: Three times a day (TID) | ORAL | Status: DC
  Administered 2024-01-03 – 2024-01-04 (×3): 64.8 mg via ORAL
  Filled 2024-01-01 (×3): qty 2

## 2024-01-01 MED ORDER — PHENOBARBITAL 32.4 MG PO TABS: 32.4000 mg | ORAL_TABLET | Freq: Three times a day (TID) | ORAL | Status: DC

## 2024-01-01 MED ORDER — PHENOBARBITAL 97.2 MG PO TABS
97.2000 mg | ORAL_TABLET | Freq: Three times a day (TID) | ORAL | Status: AC
  Administered 2024-01-01 – 2024-01-03 (×6): 97.2 mg via ORAL
  Filled 2024-01-01 (×2): qty 1
  Filled 2024-01-01: qty 3
  Filled 2024-01-01 (×3): qty 1

## 2024-01-01 NOTE — Evaluation (Signed)
 Occupational Therapy Evaluation Patient Details Name: Anthony Frank MRN: [id number] DOB: 12-Apr-1975 Today's Date: 01/01/2024   History of Present Illness   48 year old man with no significant past medical history except for alcohol abuse has had waxing and waning mental status and unsteady gait for the last few days after returning home from trip to [place].     Clinical Impressions Pt was seen for OT evaluation this date. Prior to hospital admission, pt was independent, runs his own business, and endorses high stress, pressure, and anxiety play a large role in his drinking and his ability to be who he wants to be for his family. Pt presents with deficits in balance, pain, and decreased coping skills/problem solving, affecting safe and optimal ADL completion. Pt currently requires mod indep with bed mobility, CGA for STS and ambulation without AD (~50') with intermittent slight LOB but able to self correct. Pt would benefit from skilled OT services to address noted impairments and functional limitations (see below for any additional details) in order to maximize safety and independence while minimizing future risk of falls, injury, and readmission. Do not anticipate the need for follow up OT services upon acute hospital DC. May benefit from resources for mental health and substance abuse resources. MD/RN notified.     If plan is discharge home, recommend the following:   A little help with walking and/or transfers;Help with stairs or ramp for entrance     Functional Status Assessment   Patient has had a recent decline in their functional status and demonstrates the ability to make significant improvements in function in a reasonable and predictable amount of time.     Equipment Recommendations   None recommended by OT     Recommendations for Other Services   Other (comment) (mental health and substance abuse resources)     Precautions/Restrictions    Precautions Precautions: Fall Restrictions Weight Bearing Restrictions Per Provider Order: No     Mobility Bed Mobility Overal bed mobility: Modified Independent                  Transfers Overall transfer level: Needs assistance   Transfers: Sit to/from Stand Sit to Stand: Contact guard assist                  Balance Overall balance assessment: Needs assistance Sitting-balance support: No upper extremity supported, Feet supported Sitting balance-Leahy Scale: Good     Standing balance support: No upper extremity supported, During functional activity Standing balance-Leahy Scale: Fair Standing balance comment: fair-, intermittently reaches out for wall/hall rail while ambulating, a bit unsteady                           ADL either performed or assessed with clinical judgement   ADL Overall ADL's : Needs assistance/impaired                         Toilet Transfer: Ambulance Person;Ambulation           Functional mobility during ADLs: Contact guard assist       Vision         Perception         Praxis         Pertinent Vitals/Pain Pain Assessment Pain Assessment: 0-10 Pain Score: 10-Worst pain ever Pain Location: back, headache, overall body aches Pain Descriptors / Indicators: Aching, Headache Pain Intervention(s): Monitored during session, Patient requesting pain meds-RN  notified     Extremity/Trunk Assessment Upper Extremity Assessment Upper Extremity Assessment: Overall WFL for tasks assessed   Lower Extremity Assessment Lower Extremity Assessment: Defer to PT evaluation   Cervical / Trunk Assessment Cervical / Trunk Assessment: Normal   Communication Communication Communication: No apparent difficulties   Cognition Arousal: Alert Behavior During Therapy: Anxious               OT - Cognition Comments: questionable safety awareness/awareness of deficits, poor coping skills                  Following commands: Intact       Cueing  General Comments   Cueing Techniques: Verbal cues  VSS   Exercises Other Exercises Other Exercises: Pt/family educated in role of acute OT, falls prevention   Shoulder Instructions      Home Living Family/patient expects to be discharged to:: Private residence     Type of Home: House Home Access: Stairs to enter [employer] of Steps: 5 front L rail and wall, 3 garage R rail and wall   Home Layout: One level     Bathroom Shower/Tub: Chief Strategy Officer: Standard     Home Equipment: None          Prior Functioning/Environment Prior Level of Function : Working/employed;Driving;History of Falls (last six months);Independent/Modified Independent                    OT Problem List: Pain;Decreased cognition;Impaired balance (sitting and/or standing)   OT Treatment/Interventions: Self-care/ADL training;Therapeutic activities;Cognitive remediation/compensation;DME and/or AE instruction;Patient/family education;Balance training      OT Goals(Current goals can be found in the care plan section)   Acute Rehab OT Goals Patient Stated Goal: get better OT Goal Formulation: With patient/family Time For Goal Achievement: 01/15/24 Potential to Achieve Goals: Good ADL Goals Additional ADL Goal #1: Pt will verbalize plan to implement at least 1 learned stress management coping strategy to support overall health and well being while decreasing falls risk. Additional ADL Goal #2: Pt will complete all aspects of morning ADL routine with mod indep, 2/2 opportunities.   OT Frequency:  Min 1X/week    Co-evaluation              AM-PAC OT 6 Clicks Daily Activity     Outcome Measure Help from another person eating meals?: None Help from another person taking care of personal grooming?: None Help from another person toileting, which includes using toliet, bedpan, or urinal?: A  Little Help from another person bathing (including washing, rinsing, drying)?: A Little Help from another person to put on and taking off regular upper body clothing?: None Help from another person to put on and taking off regular lower body clothing?: A Little 6 Click Score: 21   End of Session Equipment Utilized During Treatment: Gait belt Nurse Communication: Mobility status;Patient requests pain meds  Activity Tolerance: Patient tolerated treatment well Patient left: in bed;with call bell/phone within reach;with bed alarm set;with family/visitor present  OT Visit Diagnosis: Unsteadiness on feet (R26.81)                Time: 8383-8355 OT Time Calculation (min): 28 min Charges:  OT General Charges $OT Visit: 1 Visit OT Evaluation $OT Eval Low Complexity: 1 Low OT Treatments $Therapeutic Activity: 8-22 mins  Warren SAUNDERS., MPH, MS, OTR/L ascom [phone] 01/01/24, 4:59 PM

## 2024-01-01 NOTE — Progress Notes (Signed)
 PROGRESS NOTE    Anthony Frank  FMW:[id number] DOB: [birth year] DOA: 12/31/2023 PCP: Pcp, No    Assessment & Plan:   Principal Problem:   Acute hyperactive alcohol withdrawal delirium (HCC) Active Problems:   AMS (altered mental status)  Assessment and Plan: Acute metabolic encephalopathy: likely secondary to alcohol w/drawl. Hx of binge drinking as per pt's family. Re-orient prn. CT head and MRI brain showed no acute intracranial abnormalities. EEG neg for seizures. Neuro recs apprec   Alcohol withdrawal: received alcohol cessation counseling already. Continue on CIWA protocol. Started on phenobarb taper. Continue on high dose of thiamine  SIRS: likely reactive. No signs of infection currently. Viral PCR panel is pending   Gait instability: OT/PT consulted    DVT prophylaxis: lovenox  Code Status: full  Family Communication: discussed pt's care w/ pt's family at bedside and answered their questions Disposition Plan: likely d/c back home   Level of care: Progressive  Status is: Inpatient Remains inpatient appropriate because: severity of illness, on CIWA protocol     Consultants:  Neuro   Procedures:   Antimicrobials:  Subjective: Pt c/o headache and anxiety   Objective: Vitals:   01/01/24 0645 01/01/24 0738 01/01/24 0800 01/01/24 0830  BP: 127/80 116/70 121/70 117/76  Pulse: 80 74 88 74  Resp:  13 15 13   Temp:  98.4 F (36.9 C)    TempSrc:  Oral    SpO2:  96% 98% 98%  Weight:      Height:        Intake/Output Summary (Last 24 hours) at 01/01/2024 1004 Last data filed at 01/01/2024 0706 Gross per 24 hour  Intake 91.4 ml  Output --  Net 91.4 ml   Filed Weights   12/31/23 0416 12/31/23 0900  Weight: 71 kg 71 kg    Examination:  General exam: Appears calm and comfortable  Respiratory system: Clear to auscultation. Respiratory effort normal. Cardiovascular system: S1 & S2+. No  rubs, gallops or clicks.  Gastrointestinal system: Abdomen  is nondistended, soft and nontender. Normal bowel sounds heard. Central nervous system: Alert and awake. Moves all extremities  Psychiatry: Judgement and insight appears improved.    Data Reviewed: I have personally reviewed following labs and imaging studies  CBC: Recent Labs  Lab 12/31/23 0420  WBC 15.4*  HGB 17.2*  HCT 48.7  MCV 88.2  PLT 230   Basic Metabolic Panel: Recent Labs  Lab 12/31/23 0420  NA 141  K 3.8  CL 104  CO2 21*  GLUCOSE 135*  BUN 13  CREATININE 1.59*  CALCIUM 8.2*   GFR: Estimated Creatinine Clearance: 56.8 mL/min (A) (by C-G formula based on SCr of 1.59 mg/dL (H)). Liver Function Tests: Recent Labs  Lab 12/31/23 0420  AST 51*  ALT 43  ALKPHOS 82  BILITOT 0.4  PROT 6.9  ALBUMIN 4.2   Recent Labs  Lab 12/31/23 0420 01/01/24 0610  LIPASE 115* 96*   Recent Labs  Lab 01/01/24 0108  AMMONIA 29   Coagulation Profile: No results for input(s): INR, PROTIME in the last 168 hours. Cardiac Enzymes: No results for input(s): CKTOTAL, CKMB, CKMBINDEX, TROPONINI in the last 168 hours. BNP (last 3 results) No results for input(s): PROBNP in the last 8760 hours. HbA1C: No results for input(s): HGBA1C in the last 72 hours. CBG: No results for input(s): GLUCAP in the last 168 hours. Lipid Profile: No results for input(s): CHOL, HDL, LDLCALC, TRIG, CHOLHDL, LDLDIRECT in the last 72 hours. Thyroid Function Tests: Recent  Labs    12/31/23 0420  TSH 0.270*  FREET4 1.10   Anemia Panel: Recent Labs    12/31/23 0420  VITAMINB12 1,040*   Sepsis Labs: No results for input(s): PROCALCITON, LATICACIDVEN in the last 168 hours.  No results found for this or any previous visit (from the past 240 hours).       Radiology Studies: EEG adult Result Date: 12/31/2023 Shelton Arlin KIDD, MD     12/31/2023  4:32 PM Patient Name: Anthony Frank MRN: [id number] Epilepsy Attending: Arlin KIDD Shelton Referring  Physician/Provider: Laurita Cort DASEN, MD Date: 12/31/2023 Duration: 26.22 mins Patient history: 48yo M with ams. EEG to evaluate for seizure Level of alertness: Awake, asleep AEDs during EEG study: Ativan Technical aspects: This EEG study was done with scalp electrodes positioned according to the 10-20 International system of electrode placement. Electrical activity was reviewed with band pass filter of 1-70Hz , sensitivity of 7 uV/mm, display speed of 46mm/sec with a 60Hz  notched filter applied as appropriate. EEG data were recorded continuously and digitally stored.  Video monitoring was available and reviewed as appropriate. Description: The posterior dominant rhythm consists of 9-10 Hz activity of moderate voltage (25-35 uV) seen predominantly in posterior head regions, symmetric and reactive to eye opening and eye closing. Sleep was characterized by vertex waves, sleep spindles (12 to 14 Hz), maximal frontocentral region. There is an excessive amount of 15 to 18 Hz beta activity distributed symmetrically and diffusely. EEG showed intermittent generalized 3 to 6 Hz theta-delta slowing. Hyperventilation and photic stimulation were not performed.   ABNORMALITY - Intermittent slow, generalized - Excessive beta, generalized IMPRESSION: This study is suggestive of mild generalized cerebral dysfunction (encephalopathy). The excessive beta activity seen in the background is most likely due to the effect of benzodiazepine and is a benign EEG pattern. No seizures or epileptiform discharges were seen throughout the recording. Arlin KIDD Shelton ]  MR BRAIN WO CONTRAST Result Date: 12/31/2023 EXAM: MRI BRAIN WITHOUT CONTRAST 12/31/2023 11:55:00 AM TECHNIQUE: Multiplanar multisequence MRI of the head/brain was performed without the administration of intravenous contrast. COMPARISON: Head CT 12/31/2023 05:43:00 AM. CLINICAL HISTORY: 48 year old male with altered mental status. FINDINGS: BRAIN AND VENTRICLES: No acute infarct.  No intracranial hemorrhage. No mass. No midline shift. No hydrocephalus. Normal brain volume. Normal for age gray and white matter signal. No cortical encephalomalacia or chronic cerebral blood products. Deep gray nuclei, brainstem, and cerebellum appear normal. Distal left vertebral artery flow void appears to be dominant, normal variant. The sella is unremarkable. Normal flow voids. ORBITS: No acute abnormality. SINUSES AND MASTOIDS: No acute abnormality. BONES AND SOFT TISSUES: Normal marrow signal. No acute soft tissue abnormality. Normal visible cervical spine. IMPRESSION: 1. Normal for age non-contrast MRI appearance of the brain. Electronically signed by: Helayne Hurst MD 12/31/2023 12:38 PM EST RP Workstation: [id number]   DG Chest 1 View Result Date: 12/31/2023 CLINICAL DATA:  Altered mental status. EXAM: CHEST  1 VIEW COMPARISON:  None. FINDINGS: Lungs are hypoinflated without lobar consolidation or effusion. Triangular opacity over the right lower lung which may be artifactual. Cardiomediastinal silhouette and remainder of the exam is unremarkable. IMPRESSION: 1. Hypoinflation without acute cardiopulmonary disease. 2. Triangular opacity over the right lower lung which may be artifactual. Recommend repeat PA and lateral chest x-ray. Electronically Signed   By: Toribio Agreste M.D.   On: 12/31/2023 09:36   CT HEAD WO CONTRAST Result Date: 12/31/2023 CLINICAL DATA:  Altered mental status. EXAM: CT HEAD WITHOUT CONTRAST TECHNIQUE: Contiguous axial  images were obtained from the base of the skull through the vertex without intravenous contrast. RADIATION DOSE REDUCTION: This exam was performed according to the departmental dose-optimization program which includes automated exposure control, adjustment of the mA and/or kV according to patient size and/or use of iterative reconstruction technique. COMPARISON:  No comparison studies available. FINDINGS: Brain: There is no evidence for acute hemorrhage,  hydrocephalus, mass lesion, or abnormal extra-axial fluid collection. No definite CT evidence for acute infarction. Vascular: No hyperdense vessel or unexpected calcification. Skull: No evidence for fracture. No worrisome lytic or sclerotic lesion. Sinuses/Orbits: The visualized paranasal sinuses and mastoid air cells are clear. Visualized portions of the globes and intraorbital fat are unremarkable. Other: None. IMPRESSION: No acute intracranial abnormality. Electronically Signed   By: Camellia Candle M.D.   On: 12/31/2023 05:50        Scheduled Meds:  enoxaparin (LOVENOX) injection  40 mg Subcutaneous Q24H   folic acid  1 mg Oral Daily   LORazepam  0-4 mg Intravenous Q6H   Or   LORazepam  0-4 mg Oral Q6H   [START ON 01/02/2024] LORazepam  0-4 mg Intravenous Q12H   Or   [START ON 01/02/2024] LORazepam  0-4 mg Oral Q12H   LORazepam  2 mg Intravenous Once   multivitamin with minerals  1 tablet Oral Daily   [START ON 01/08/2024] thiamine (VITAMIN B1) injection  100 mg Intravenous Daily   Continuous Infusions:  thiamine (VITAMIN B1) injection Stopped (01/01/24 0706)   Followed by   NOREEN ON 01/02/2024] thiamine (VITAMIN B1) injection       LOS: 1 day      Anthony CHRISTELLA Pouch, MD [facility] Pager [phone]  If 7PM-7AM, please contact night-coverage [email or web address] 01/01/2024, 10:04 AM

## 2024-01-01 NOTE — ED Notes (Signed)
 Pt visitor given coffee as requesting. Explained to patient to let nurse know if he is still having a headache in 1 hour for further medications.

## 2024-01-01 NOTE — Progress Notes (Signed)
 NEUROLOGY CONSULT FOLLOW UP NOTE   Date of service: January 01, 2024 Patient Name: Anthony Frank MRN:  [id number] DOB:  1975-03-24  Interval Hx/subjective  Seen and examined. More awake and oriented today. Reports feeling very anxious.  Does not remember when he had his last drink or how much has he been drinking.  Vitals   Vitals:   01/01/24 0830 01/01/24 0900 01/01/24 0930 01/01/24 1000  BP: 117/76 130/84 119/60 (!) 137/93  Pulse: 74 83 78 94  Resp: 13 15 14 18   Temp:      TempSrc:      SpO2: 98% 99% 96% 98%  Weight:      Height:         Body mass index is 23.11 kg/m.  Physical Exam   General: Awake alert, appears somewhat tremulous in general Cardiovascular regular rate rhythm Respiratory: Breathing well saturating normally Frank room air Abdomen nondistended nontender Neurological exam Awake alert oriented x 3 No dysarthria No aphasia Cranial nerves II to XII intact Motor examination with no drift in any of the 4 extremities Sensation intact light touch Coordination examination reveals no dysmetria   Medications  Current Facility-Administered Medications:    acetaminophen (TYLENOL) tablet 650 mg, 650 mg, Oral, Q6H PRN, Anthony Cort DASEN, Frank   butalbital-acetaminophen-caffeine (FIORICET) 50-325-40 MG per tablet 1 tablet, 1 tablet, Oral, Q6H PRN, Anthony Anthony HERO, Frank   enoxaparin (LOVENOX) injection 40 mg, 40 mg, Subcutaneous, Q24H, Anthony Frank, 40 mg at 12/31/23 2155   folic acid (FOLVITE) tablet 1 mg, 1 mg, Oral, Daily, Anthony Cort Frank, Frank, 1 mg at 12/31/23 1540   HYDROcodone-acetaminophen (NORCO/VICODIN) 5-325 MG per tablet 1 tablet, 1 tablet, Oral, Q6H PRN, Anthony Anthony HERO, Frank   LORazepam (ATIVAN) injection 0-4 mg, 0-4 mg, Intravenous, Q6H, 1 mg at 01/01/24 0653 **OR** LORazepam (ATIVAN) tablet 0-4 mg, 0-4 mg, Oral, Q6H, Anthony Pastor, Frank   Anthony Frank 01/02/2024] LORazepam (ATIVAN) injection 0-4 mg, 0-4 mg, Intravenous, Q12H **OR** [START Frank  01/02/2024] LORazepam (ATIVAN) tablet 0-4 mg, 0-4 mg, Oral, Q12H, Anthony Pastor, Frank   LORazepam (ATIVAN) injection 2 mg, 2 mg, Intravenous, Once, Anthony Pastor, Frank   LORazepam (ATIVAN) injection 2 mg, 2 mg, Intravenous, Q6H PRN, Anthony Frank   multivitamin with minerals tablet 1 tablet, 1 tablet, Oral, Daily, Anthony Cort Frank, Frank, 1 tablet at 12/31/23 1540   ondansetron (ZOFRAN) injection 4 mg, 4 mg, Intravenous, Q6H PRN, Anthony Frank   thiamine (VITAMIN B1) 500 mg in sodium chloride 0.9 % 50 mL IVPB, 500 mg, Intravenous, Q8H, Stopped at 01/01/24 0706 **FOLLOWED BY** [START Frank 01/02/2024] thiamine (VITAMIN B1) 250 mg in sodium chloride 0.9 % 50 mL IVPB, 250 mg, Intravenous, Daily **FOLLOWED BY** [START Frank 01/08/2024] thiamine (VITAMIN B1) injection 100 mg, 100 mg, Intravenous, Daily, Anthony Whisenant, Frank No current outpatient medications Frank file.  Labs and Diagnostic Imaging   CBC:  Recent Labs  Lab 12/31/23 0420  WBC 15.4*  HGB 17.2*  HCT 48.7  MCV 88.2  PLT 230    Basic Metabolic Panel:  Lab Results  Component Value Date   NA 141 12/31/2023   K 3.8 12/31/2023   CO2 21 (L) 12/31/2023   GLUCOSE 135 (H) 12/31/2023   BUN 13 12/31/2023   CREATININE 1.59 (H) 12/31/2023   CALCIUM 8.2 (L) 12/31/2023   GFRNONAA 53 (L) 12/31/2023  Ammonia 29 Urinalysis unremarkable UDS positive for benzodiazepines-these were given to him in the hospital TSH  0.27 B12 1040 Imaging personally reviewed MRI brain: Normal for age. Chest x-ray: Hypoinflation without acute cardiopulmonary process.  Radiology recommends a chest x-ray PA and lateral for triangular opacity over right lung base  rEEG:  Intermittent generalized slowing and excessive beta likely due to medication side effect from benzodiazepines.  Assessment  [name] is a 48 year old man with no significant past medical history except for alcohol abuse who has probably been consuming excessive amounts of alcohol and  probably quit drinking suddenly who has had waxing and waning mental status for the last few days.  He was extremely encephalopathic yesterday when I examined him. He had AKI and mild leukocytosis along with elevation of lipase and AST. Symptoms likely related to toxic metabolic encephalopathy in the setting of excessive alcohol use followed by sudden cessation and AKI. At this time, reports extreme anxiety and asks if he can get some medication for anxiety.  His travels to [place] back-and-forth were extremely anxiety provoking for him.  Recommendations  I would continue thiamine replacement Per radiology recommendation, will get chest x-ray 2 views. Counseled Frank alcohol cessation Reports extreme anxiety-management per primary team No further inpatient workup at this time Plan relayed to Dr. Trudy ______________________________________________________________________   Signed, Anthony Lav, Frank [facility]

## 2024-01-02 LAB — COMPREHENSIVE METABOLIC PANEL WITH GFR
ALT: 44 U/L (ref 0–44)
AST: 59 U/L — ABNORMAL HIGH (ref 15–41)
Albumin: 3.9 g/dL (ref 3.5–5.0)
Alkaline Phosphatase: 64 U/L (ref 38–126)
Anion gap: 9 (ref 5–15)
BUN: 10 mg/dL (ref 6–20)
CO2: 23 mmol/L (ref 22–32)
Calcium: 8.6 mg/dL — ABNORMAL LOW (ref 8.9–10.3)
Chloride: 104 mmol/L (ref 98–111)
Creatinine, Ser: 0.98 mg/dL (ref 0.61–1.24)
GFR, Estimated: >60 mL/min (ref >60)
Glucose, Bld: 91 mg/dL (ref 70–99)
Potassium: 4 mmol/L (ref 3.5–5.1)
Sodium: 136 mmol/L (ref 135–145)
Total Bilirubin: 0.6 mg/dL (ref 0.0–1.2)
Total Protein: 6.4 g/dL — ABNORMAL LOW (ref 6.5–8.1)

## 2024-01-02 LAB — MAGNESIUM: Magnesium: 2.3 mg/dL (ref 1.7–2.4)

## 2024-01-02 LAB — CBC
HCT: 44 % (ref 39.0–52.0)
Hemoglobin: 15.2 g/dL (ref 13.0–17.0)
MCH: 30.9 pg (ref 26.0–34.0)
MCHC: 34.5 g/dL (ref 30.0–36.0)
MCV: 89.4 fL (ref 80.0–100.0)
Platelets: 156 K/uL (ref 150–400)
RBC: 4.92 MIL/uL (ref 4.22–5.81)
RDW: 13.7 % (ref 11.5–15.5)
WBC: 7.5 K/uL (ref 4.0–10.5)
nRBC: 0 % (ref 0.0–0.2)

## 2024-01-02 MED ORDER — ACETAMINOPHEN 325 MG PO TABS: 650.0000 mg | ORAL_TABLET | Freq: Four times a day (QID) | ORAL | Status: DC | PRN

## 2024-01-02 MED ORDER — IBUPROFEN 400 MG PO TABS
600.0000 mg | ORAL_TABLET | Freq: Three times a day (TID) | ORAL | Status: DC | PRN
  Administered 2024-01-02: 600 mg via ORAL
  Filled 2024-01-02: qty 2

## 2024-01-02 NOTE — Evaluation (Signed)
 Physical Therapy Evaluation Patient Details Name: Anthony Frank MRN: [id number] DOB: 05-Dec-1975 Today's Date: 01/02/2024  History of Present Illness  Patient is a 48 year old acute metabolic encephalopathy likely secondary alcohol withdrawal.  Clinical Impression  Patient is agreeable to PT evaluation. He is independent at baseline and active. He reports he has a high stress job and his primary focus right now is managing stress and anxiety levels.  Today the patient required no assistance for bed mobility. He walked a lap in the hallway, mild unsteadiness at times. He reports feeling generally weaker than normal with mild dizziness reported with activity. Recommend to continue PT to maximize independence and facilitate return to prior level of function. Consider outpatient PT if symptoms have not resolved.       If plan is discharge home, recommend the following: Assist for transportation;Help with stairs or ramp for entrance   Can travel by private vehicle        Equipment Recommendations None recommended by PT  Recommendations for Other Services       Functional Status Assessment Patient has had a recent decline in their functional status and demonstrates the ability to make significant improvements in function in a reasonable and predictable amount of time.     Precautions / Restrictions Precautions Precautions: Fall Recall of Precautions/Restrictions: Intact Restrictions Weight Bearing Restrictions Per Provider Order: No      Mobility  Bed Mobility Overal bed mobility: Modified Independent                  Transfers Overall transfer level: Needs assistance Equipment used: None Transfers: Sit to/from Stand Sit to Stand: Contact guard assist                Ambulation/Gait Ambulation/Gait assistance: Contact guard assist Gait Distance (Feet): 175 Feet Assistive device: None Gait Pattern/deviations: Step-through pattern, Decreased stride  length, Staggering right, Drifts right/left Gait velocity: decreased     General Gait Details: occasional unsteadiness when challenged with head turn and distraction  Stairs            Wheelchair Mobility     Tilt Bed    Modified Rankin (Stroke Patients Only)       Balance Overall balance assessment: Needs assistance Sitting-balance support: No upper extremity supported, Feet supported Sitting balance-Leahy Scale: Good     Standing balance support: No upper extremity supported, During functional activity Standing balance-Leahy Scale: Fair Standing balance comment: fair to poor with dynamic balance                             Pertinent Vitals/Pain Pain Assessment Pain Assessment: No/denies pain    Home Living Family/patient expects to be discharged to:: Private residence Living Arrangements:  (ex-wife) Available Help at Discharge: Available PRN/intermittently Type of Home: House Home Access: Stairs to enter   [employer] of Steps: 5 front L rail and wall, 3 garage R rail and wall   Home Layout: One level Home Equipment: None      Prior Function Prior Level of Function : Independent/Modified Independent;Driving;Working/employed             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       Extremity/Trunk Assessment   Upper Extremity Assessment Upper Extremity Assessment: Overall WFL for tasks assessed    Lower Extremity Assessment Lower Extremity Assessment: Overall WFL for tasks assessed       Communication   Communication Communication:  No apparent difficulties    Cognition Arousal: Alert Behavior During Therapy: Anxious   PT - Cognitive impairments: No family/caregiver present to determine baseline                         Following commands: Intact       Cueing Cueing Techniques: Verbal cues     General Comments      Exercises     Assessment/Plan    PT Assessment Patient  needs continued PT services  PT Problem List Decreased range of motion;Decreased strength;Decreased activity tolerance;Decreased balance;Decreased mobility       PT Treatment Interventions DME instruction;Gait training;Stair training;Functional mobility training;Therapeutic activities;Therapeutic exercise;Balance training;Patient/family education;Neuromuscular re-education    PT Goals (Current goals can be found in the Care Plan section)  Acute Rehab PT Goals Patient Stated Goal: to manage anxiety level PT Goal Formulation: With patient Time For Goal Achievement: 01/16/24 Potential to Achieve Goals: Fair    Frequency Min 2X/week     Co-evaluation               AM-PAC PT 6 Clicks Mobility  Outcome Measure Help needed turning from your back to your side while in a flat bed without using bedrails?: None Help needed moving from lying on your back to sitting on the side of a flat bed without using bedrails?: A Little Help needed moving to and from a bed to a chair (including a wheelchair)?: A Little Help needed standing up from a chair using your arms (e.g., wheelchair or bedside chair)?: A Little Help needed to walk in hospital room?: A Little Help needed climbing 3-5 steps with a railing? : A Little 6 Click Score: 19    End of Session   Activity Tolerance: Patient tolerated treatment well Patient left: in bed;with call bell/phone within reach;with bed alarm set   PT Visit Diagnosis: Difficulty in walking, not elsewhere classified (R26.2);Unsteadiness on feet (R26.81)    Time: 9161-9149 PT Time Calculation (min) (ACUTE ONLY): 12 min   Charges:   PT Evaluation $PT Eval Low Complexity: 1 Low   PT General Charges $$ ACUTE PT VISIT: 1 Visit         Randine Essex, PT, MPT   Randine LULLA Essex 01/02/2024, 9:54 AM

## 2024-01-02 NOTE — Discharge Instructions (Signed)

## 2024-01-02 NOTE — TOC Progression Note (Addendum)
 Transition of Care [facility]) - Progression Note    Patient Details  Name: Nijel Flink MRN: [id number] Date of Birth: Oct 30, 1975  [facility] CM/SW Contact  Shasta DELENA Daring, RN Phone Number: 01/02/2024, 1:56 PM  Clinical Narrative:    11/14: Substance abuse resources added to AVS.  2:56 PM counseling/mental health resources given to patient. Advised that additional resources will be available on his discharge paperwork. Patient verbalized understanding.  No additional TOC needs identified.  Expected Discharge Plan: Home/Self Care Barriers to Discharge: Continued Medical Work up               Expected Discharge Plan and Services                                               Social Drivers of Health (SDOH) Interventions SDOH Screenings   Food Insecurity: No Food Insecurity (01/02/2024)  Housing: Unknown (01/02/2024)  Transportation Needs: No Transportation Needs (01/02/2024)  Utilities: Not At Risk (01/02/2024)  Tobacco Use: High Risk (01/21/2023)   Received from [facility]    Readmission Risk Interventions     No data to display

## 2024-01-02 NOTE — Progress Notes (Signed)
   01/02/24 1045  Spiritual Encounters  Type of Visit Initial  Care provided to: Patient  Conversation partners present during encounter Nurse  Referral source Patient request  Reason for visit Routine spiritual support  OnCall Visit Yes  Spiritual Framework  Presenting Themes Meaning/purpose/sources of inspiration;Goals in life/care;Values and beliefs;Coping tools;Community and relationships  Community/Connection Family;Other (comment) (Coworkers)  Strengths Faith, Family  Patient Stress Factors Family relationships;Other (Comment) (Anxiety)  Interventions  Spiritual Care Interventions Made Compassionate presence;Reflective listening;Reconciliation with self/others;Narrative/life review;Prayer   Met and talked with patient about life situations that have caused stress and anxiety in his life.  Provided listening and compassionate presence.  Discussed some depression, but focused on anxiety issues with family and relationships.  Prayed with patient upon his request.

## 2024-01-02 NOTE — Plan of Care (Signed)

## 2024-01-02 NOTE — Progress Notes (Signed)
 PROGRESS NOTE    Anthony Frank  FMW:[id number] DOB: 06-Aug-1975 DOA: 12/31/2023 PCP: Pcp, No    Assessment & Plan:   Principal Problem:   Acute hyperactive alcohol withdrawal delirium (HCC) Active Problems:   AMS (altered mental status)  Assessment and Plan: Acute metabolic encephalopathy: likely secondary to alcohol w/drawl. Hx of binge drinking as per pt's family. Re-orient prn. CT head and MRI brain showed no acute intracranial abnormalities. EEG neg for seizures. Mental status is close to baseline. Neuro recs apprec   Alcohol withdrawal: received alcohol cessation counseling already. Continue on CIWA protocol, phenobarb taper & high dose thiamine. Pt wants to stop drinking and agreed to take outpatient resources given by CM   SIRS: likely reactive. No signs of infection currently. Viral PCR panel is neg. Resolved  Gait instability: PT recs outpatient PT     DVT prophylaxis: lovenox  Code Status: full  Family Communication: discussed pt's care w/ pt's family at bedside and answered their questions Disposition Plan: likely d/c back home   Level of care: Progressive  Status is: Inpatient Remains inpatient appropriate because: severity of illness, on CIWA protocol (still scoring on CIWA)     Consultants:  Neuro   Procedures:   Antimicrobials:  Subjective: Pt c/o headache, anxiety, fear & dizziness.   Objective: Vitals:   01/01/24 2102 01/02/24 0031 01/02/24 0317 01/02/24 0837  BP: 139/88 (!) 146/97 133/83 136/87  Pulse: 99 (!) 104 90 84  Resp: 20 18 17 16   Temp: 97.8 F (36.6 C) 98.1 F (36.7 C) 97.8 F (36.6 C) 98 F (36.7 C)  TempSrc:      SpO2: 98% 98% 96% 98%  Weight:      Height:        Intake/Output Summary (Last 24 hours) at 01/02/2024 0903 Last data filed at 01/01/2024 2013 Gross per 24 hour  Intake 120 ml  Output --  Net 120 ml   Filed Weights   12/31/23 0416 12/31/23 0900  Weight: 71 kg 71 kg    Examination:  General  exam: appears anxious Respiratory system: clear breath sounds b/l  Cardiovascular system: S1/S2+. No rubs or clicks Gastrointestinal system: Abd is soft, NT, ND & normal bowel sounds. Central nervous system: alert & awake. Moves all extremities Psychiatry: Judgement and insight appears improved. Anxious mood and affect   Data Reviewed: I have personally reviewed following labs and imaging studies  CBC: Recent Labs  Lab 12/31/23 0420 01/02/24 0505  WBC 15.4* 7.5  HGB 17.2* 15.2  HCT 48.7 44.0  MCV 88.2 89.4  PLT 230 156   Basic Metabolic Panel: Recent Labs  Lab 12/31/23 0420 01/02/24 0505  NA 141 136  K 3.8 4.0  CL 104 104  CO2 21* 23  GLUCOSE 135* 91  BUN 13 10  CREATININE 1.59* 0.98  CALCIUM 8.2* 8.6*  MG  --  2.3   GFR: Estimated Creatinine Clearance: 92.2 mL/min (by C-G formula based on SCr of 0.98 mg/dL). Liver Function Tests: Recent Labs  Lab 12/31/23 0420 01/02/24 0505  AST 51* 59*  ALT 43 44  ALKPHOS 82 64  BILITOT 0.4 0.6  PROT 6.9 6.4*  ALBUMIN 4.2 3.9   Recent Labs  Lab 12/31/23 0420 01/01/24 0610  LIPASE 115* 96*   Recent Labs  Lab 01/01/24 0108  AMMONIA 29   Coagulation Profile: No results for input(s): INR, PROTIME in the last 168 hours. Cardiac Enzymes: No results for input(s): CKTOTAL, CKMB, CKMBINDEX, TROPONINI in the last 168  hours. BNP (last 3 results) No results for input(s): PROBNP in the last 8760 hours. HbA1C: No results for input(s): HGBA1C in the last 72 hours. CBG: No results for input(s): GLUCAP in the last 168 hours. Lipid Profile: No results for input(s): CHOL, HDL, LDLCALC, TRIG, CHOLHDL, LDLDIRECT in the last 72 hours. Thyroid Function Tests: Recent Labs    12/31/23 0420  TSH 0.270*  FREET4 1.10   Anemia Panel: Recent Labs    12/31/23 0420  VITAMINB12 1,040*   Sepsis Labs: No results for input(s): PROCALCITON, LATICACIDVEN in the last 168 hours.  Recent Results  (from the past 240 hours)  Respiratory (~20 pathogens) panel by PCR     Status: None   Collection Time: 01/01/24 11:36 AM   Specimen: Nasopharyngeal Swab; Respiratory  Result Value Ref Range Status   Adenovirus NOT DETECTED NOT DETECTED Final   Coronavirus 229E NOT DETECTED NOT DETECTED Final    Comment: (NOTE) The Coronavirus on the Respiratory Panel, DOES NOT test for the novel  Coronavirus (2019 nCoV)    Coronavirus HKU1 NOT DETECTED NOT DETECTED Final   Coronavirus NL63 NOT DETECTED NOT DETECTED Final   Coronavirus OC43 NOT DETECTED NOT DETECTED Final   Metapneumovirus NOT DETECTED NOT DETECTED Final   Rhinovirus / Enterovirus NOT DETECTED NOT DETECTED Final   Influenza A NOT DETECTED NOT DETECTED Final   Influenza B NOT DETECTED NOT DETECTED Final   Parainfluenza Virus 1 NOT DETECTED NOT DETECTED Final   Parainfluenza Virus 2 NOT DETECTED NOT DETECTED Final   Parainfluenza Virus 3 NOT DETECTED NOT DETECTED Final   Parainfluenza Virus 4 NOT DETECTED NOT DETECTED Final   Respiratory Syncytial Virus NOT DETECTED NOT DETECTED Final   Bordetella pertussis NOT DETECTED NOT DETECTED Final   Bordetella Parapertussis NOT DETECTED NOT DETECTED Final   Chlamydophila pneumoniae NOT DETECTED NOT DETECTED Final   Mycoplasma pneumoniae NOT DETECTED NOT DETECTED Final    Comment: Performed at [facility], [address] [place], [place] [address]         Radiology Studies: DG Chest 2 View Result Date: 01/01/2024 CLINICAL DATA:  Pneumonia EXAM: CHEST - 2 VIEW COMPARISON:  Chest x-ray performed December 31, 2023 FINDINGS: There is improved aeration of the lung. The previously observed triangular opacity is less distinct. Heart mediastinum are not significantly changed. No significant pleural effusion or pneumothorax. IMPRESSION: 1. Improved aeration of the lung. 2. Less distinct appearance of triangular opacity in the right mid lung zone. Differential considerations include linear  atelectasis (favored), developing infiltrate, and pulmonary scarring. Electronically Signed   By: Maude Naegeli M.D.   On: 01/01/2024 11:21   EEG adult Result Date: 12/31/2023 Shelton Arlin KIDD, MD     12/31/2023  4:32 PM Patient Name: Anthony Frank MRN: [id number] Epilepsy Attending: Arlin KIDD Shelton Referring Physician/Provider: Laurita Cort DASEN, MD Date: 12/31/2023 Duration: 26.22 mins Patient history: 48yo M with ams. EEG to evaluate for seizure Level of alertness: Awake, asleep AEDs during EEG study: Ativan Technical aspects: This EEG study was done with scalp electrodes positioned according to the 10-20 International system of electrode placement. Electrical activity was reviewed with band pass filter of 1-70Hz , sensitivity of 7 uV/mm, display speed of 29mm/sec with a 60Hz  notched filter applied as appropriate. EEG data were recorded continuously and digitally stored.  Video monitoring was available and reviewed as appropriate. Description: The posterior dominant rhythm consists of 9-10 Hz activity of moderate voltage (25-35 uV) seen predominantly in posterior head regions,  symmetric and reactive to eye opening and eye closing. Sleep was characterized by vertex waves, sleep spindles (12 to 14 Hz), maximal frontocentral region. There is an excessive amount of 15 to 18 Hz beta activity distributed symmetrically and diffusely. EEG showed intermittent generalized 3 to 6 Hz theta-delta slowing. Hyperventilation and photic stimulation were not performed.   ABNORMALITY - Intermittent slow, generalized - Excessive beta, generalized IMPRESSION: This study is suggestive of mild generalized cerebral dysfunction (encephalopathy). The excessive beta activity seen in the background is most likely due to the effect of benzodiazepine and is a benign EEG pattern. No seizures or epileptiform discharges were seen throughout the recording. Arlin MALVA Krebs ]  MR BRAIN WO CONTRAST Result Date: 12/31/2023 EXAM: MRI BRAIN  WITHOUT CONTRAST 12/31/2023 11:55:00 AM TECHNIQUE: Multiplanar multisequence MRI of the head/brain was performed without the administration of intravenous contrast. COMPARISON: Head CT 12/31/2023 05:43:00 AM. CLINICAL HISTORY: 48 year old male with altered mental status. FINDINGS: BRAIN AND VENTRICLES: No acute infarct. No intracranial hemorrhage. No mass. No midline shift. No hydrocephalus. Normal brain volume. Normal for age gray and white matter signal. No cortical encephalomalacia or chronic cerebral blood products. Deep gray nuclei, brainstem, and cerebellum appear normal. Distal left vertebral artery flow void appears to be dominant, normal variant. The sella is unremarkable. Normal flow voids. ORBITS: No acute abnormality. SINUSES AND MASTOIDS: No acute abnormality. BONES AND SOFT TISSUES: Normal marrow signal. No acute soft tissue abnormality. Normal visible cervical spine. IMPRESSION: 1. Normal for age non-contrast MRI appearance of the brain. Electronically signed by: Helayne Hurst MD 12/31/2023 12:38 PM EST RP Workstation: [id number]   DG Chest 1 View Result Date: 12/31/2023 CLINICAL DATA:  Altered mental status. EXAM: CHEST  1 VIEW COMPARISON:  None. FINDINGS: Lungs are hypoinflated without lobar consolidation or effusion. Triangular opacity over the right lower lung which may be artifactual. Cardiomediastinal silhouette and remainder of the exam is unremarkable. IMPRESSION: 1. Hypoinflation without acute cardiopulmonary disease. 2. Triangular opacity over the right lower lung which may be artifactual. Recommend repeat PA and lateral chest x-ray. Electronically Signed   By: Toribio Agreste M.D.   On: 12/31/2023 09:36        Scheduled Meds:  enoxaparin (LOVENOX) injection  40 mg Subcutaneous Q24H   folic acid  1 mg Oral Daily   LORazepam  0-4 mg Intravenous Q12H   Or   LORazepam  0-4 mg Oral Q12H   LORazepam  2 mg Intravenous Once   multivitamin with minerals  1 tablet Oral Daily    phenobarbital  97.2 mg Oral Q8H   Followed by   NOREEN ON 01/03/2024] phenobarbital  64.8 mg Oral Q8H   Followed by   NOREEN ON 01/05/2024] phenobarbital  32.4 mg Oral Q8H   [START ON 01/08/2024] thiamine (VITAMIN B1) injection  100 mg Intravenous Daily   Continuous Infusions:  thiamine (VITAMIN B1) injection       LOS: 2 days      Anthony CHRISTELLA Pouch, MD [facility] Pager [phone]  If 7PM-7AM, please contact night-coverage [email or web address] 01/02/2024, 9:03 AM

## 2024-01-02 NOTE — TOC Initial Note (Signed)
 Transition of Care [facility]) - Initial/Assessment Note    Patient Details  Name: Anthony Frank MRN: [id number] Date of Birth: [birth year]  [facility] CM/SW Contact:    Shasta DELENA Daring, RN Phone Number: 01/02/2024, 10:19 AM  Clinical Narrative:                 RNCM assessed patient. Plans to discharge home. Confirmed no PCP and no insurance coverage. Provided resources for free clinics in area. Patient daughter will pick him up at discharge and transport him home. No additional TOC needs identified.  Will monitor for discharge readiness.  Expected Discharge Plan: Home/Self Care Barriers to Discharge: Continued Medical Work up   Patient Goals and CMS Choice            Expected Discharge Plan and Services                                              Prior Living Arrangements/Services     Patient language and need for interpreter reviewed:: Yes Do you feel safe going back to the place where you live?: Yes      Need for Family Participation in Patient Care: Yes (Comment) Care giver support system in place?: Yes (comment)   Criminal Activity/Legal Involvement Pertinent to Current Situation/Hospitalization: No - Comment as needed  Activities of Daily Living      Permission Sought/Granted                  Emotional Assessment Appearance:: Appears stated age Attitude/Demeanor/Rapport: Engaged, Gracious Affect (typically observed): Pleasant, Stable Orientation: : Oriented to Self, Oriented to Place, Oriented to  Time, Oriented to Situation Alcohol / Substance Use: Not Applicable Psych Involvement: No (comment)  Admission diagnosis:  Stupor [R40.1] Alcohol withdrawal syndrome with complication (HCC) [F10.939] Acute hyperactive alcohol withdrawal delirium (HCC) [F10.931] AMS (altered mental status) [R41.82] Patient Active Problem List   Diagnosis Date Noted   Acute hyperactive alcohol withdrawal delirium (HCC) 12/31/2023   AMS (altered  mental status) 12/31/2023   Alcohol withdrawal syndrome with complication (HCC) 01/21/2023   Alcohol drinking problem 01/21/2023   PCP:  Pcp, No Pharmacy:   [id number] [place], [place] - [address] AT [facility] [address] [place] [address] Phone: [phone] Fax: [phone]     Social Drivers of Health (SDOH) Social History: SDOH Screenings   Food Insecurity: No Food Insecurity (01/02/2024)  Housing: Unknown (01/02/2024)  Transportation Needs: No Transportation Needs (01/02/2024)  Utilities: Not At Risk (01/02/2024)  Tobacco Use: High Risk (01/21/2023)   Received from [facility]   SDOH Interventions:     Readmission Risk Interventions     No data to display

## 2024-01-03 LAB — CBC
HCT: 48.8 % (ref 39.0–52.0)
Hemoglobin: 16.8 g/dL (ref 13.0–17.0)
MCH: 30.6 pg (ref 26.0–34.0)
MCHC: 34.4 g/dL (ref 30.0–36.0)
MCV: 88.9 fL (ref 80.0–100.0)
Platelets: 163 K/uL (ref 150–400)
RBC: 5.49 MIL/uL (ref 4.22–5.81)
RDW: 13.2 % (ref 11.5–15.5)
WBC: 5.6 K/uL (ref 4.0–10.5)
nRBC: 0 % (ref 0.0–0.2)

## 2024-01-03 LAB — COMPREHENSIVE METABOLIC PANEL WITH GFR
ALT: 63 U/L — ABNORMAL HIGH (ref 0–44)
AST: 71 U/L — ABNORMAL HIGH (ref 15–41)
Albumin: 4.2 g/dL (ref 3.5–5.0)
Alkaline Phosphatase: 71 U/L (ref 38–126)
Anion gap: 8 (ref 5–15)
BUN: 12 mg/dL (ref 6–20)
CO2: 22 mmol/L (ref 22–32)
Calcium: 8.9 mg/dL (ref 8.9–10.3)
Chloride: 103 mmol/L (ref 98–111)
Creatinine, Ser: 0.89 mg/dL (ref 0.61–1.24)
GFR, Estimated: >60 mL/min (ref >60)
Glucose, Bld: 88 mg/dL (ref 70–99)
Potassium: 4.7 mmol/L (ref 3.5–5.1)
Sodium: 134 mmol/L — ABNORMAL LOW (ref 135–145)
Total Bilirubin: 0.6 mg/dL (ref 0.0–1.2)
Total Protein: 6.9 g/dL (ref 6.5–8.1)

## 2024-01-03 LAB — MAGNESIUM: Magnesium: 2.5 mg/dL — ABNORMAL HIGH (ref 1.7–2.4)

## 2024-01-03 MED ORDER — POLYETHYLENE GLYCOL 3350 17 G PO PACK
17.0000 g | PACK | Freq: Every day | ORAL | Status: DC
  Administered 2024-01-03 – 2024-01-04 (×2): 17 g via ORAL
  Filled 2024-01-03 (×2): qty 1

## 2024-01-03 MED ORDER — DOCUSATE SODIUM 100 MG PO CAPS
200.0000 mg | ORAL_CAPSULE | Freq: Two times a day (BID) | ORAL | Status: DC
  Administered 2024-01-03 – 2024-01-04 (×3): 200 mg via ORAL
  Filled 2024-01-03 (×3): qty 2

## 2024-01-03 MED ORDER — TRAZODONE HCL 50 MG PO TABS
25.0000 mg | ORAL_TABLET | Freq: Every evening | ORAL | Status: DC | PRN
Start: 2024-01-03 — End: 2024-01-04
  Administered 2024-01-03: 25 mg via ORAL
  Filled 2024-01-03: qty 1

## 2024-01-03 NOTE — TOC Progression Note (Signed)
 Transition of Care [facility]) - Progression Note    Patient Details  Name: Anthony Frank MRN: [id number] Date of Birth: January 20, 1976  [facility]) CM/SW Contact  Victory Jackquline RAMAN, RN Phone Number: 01/03/2024, 5:13 PM  Clinical Narrative:  RNCM spoke to the patient. RNCM introduced role and explained that discharge planning would be discussed. PT recommending outpatient PT. He's in agreement with it but doesn't want to set anything up until he establishes himself with a PCP first. RNCM will continue to follow for discharge planning needs.     Expected Discharge Plan: Home/Self Care Barriers to Discharge: Continued Medical Work up               Expected Discharge Plan and Services                                               Social Drivers of Health (SDOH) Interventions SDOH Screenings   Food Insecurity: No Food Insecurity (01/02/2024)  Housing: Unknown (01/02/2024)  Transportation Needs: No Transportation Needs (01/02/2024)  Utilities: Not At Risk (01/02/2024)  Tobacco Use: High Risk (01/21/2023)   Received from [facility]    Readmission Risk Interventions     No data to display

## 2024-01-03 NOTE — Plan of Care (Signed)
   Problem: Education: Goal: Knowledge of General Education information will improve Description Including pain rating scale, medication(s)/side effects and non-pharmacologic comfort measures Outcome: Progressing

## 2024-01-03 NOTE — Plan of Care (Signed)
  Problem: Clinical Measurements: Goal: Will remain free from infection Outcome: Progressing Goal: Diagnostic test results will improve Outcome: Progressing Goal: Cardiovascular complication will be avoided Outcome: Progressing   Problem: Activity: Goal: Risk for activity intolerance will decrease Outcome: Progressing   Problem: Nutrition: Goal: Adequate nutrition will be maintained Outcome: Progressing   Problem: Coping: Goal: Level of anxiety will decrease Outcome: Progressing   Problem: Elimination: Goal: Will not experience complications related to bowel motility Outcome: Progressing   Problem: Safety: Goal: Ability to remain free from injury will improve Outcome: Progressing   Problem: Skin Integrity: Goal: Risk for impaired skin integrity will decrease Outcome: Progressing

## 2024-01-03 NOTE — Progress Notes (Signed)
 PROGRESS NOTE    Anthony Frank  FMW:[id number] DOB: January 24, 1976 DOA: 12/31/2023 PCP: Pcp, No    Assessment & Plan:   Principal Problem:   Acute hyperactive alcohol withdrawal delirium (HCC) Active Problems:   AMS (altered mental status)  Assessment and Plan: Acute metabolic encephalopathy: likely secondary to alcohol w/drawl. Hx of binge drinking as per pt's family. Re-orient prn. CT head and MRI brain showed no acute intracranial abnormalities. EEG neg for seizures. Mental status is improving daily. Neuro recs apprec   Alcohol withdrawal: received alcohol cessation counseling already.  Still scoring on CIWA protocol, phenobarb taper & high dose of thiamine.  Pt wants to stop drinking and agreed to take outpatient resources given by CM   SIRS: likely reactive. No signs of infection currently. Viral PCR panel is neg. Resolved  Gait instability: PT recs outpatient PT     DVT prophylaxis: lovenox  Code Status: full  Family Communication: discussed pt's care w/ pt's family at bedside and answered their questions Disposition Plan: likely d/c back home   Level of care: Progressive  Status is: Inpatient Remains inpatient appropriate because: severity of illness, on CIWA protocol (still scoring on CIWA). Will likely d/c home tomorrow     Consultants:  Neuro   Procedures:   Antimicrobials:  Subjective: Pt c/o headache and anxiety still   Objective: Vitals:   01/02/24 1955 01/02/24 2336 01/03/24 0400 01/03/24 0808  BP: 134/88 (!) 136/92 (!) 143/88 (!) 125/93  Pulse: 85 90 81 83  Resp: 18 20 20    Temp: 97.8 F (36.6 C) 97.9 F (36.6 C) 98.3 F (36.8 C) 97.7 F (36.5 C)  TempSrc:      SpO2: 97% 97% 97% 98%  Weight:      Height:        Intake/Output Summary (Last 24 hours) at 01/03/2024 0925 Last data filed at 01/03/2024 0300 Gross per 24 hour  Intake 36.04 ml  Output --  Net 36.04 ml   Filed Weights   12/31/23 0416 12/31/23 0900  Weight: 71 kg 71  kg    Examination:  General exam: appears anxious  Respiratory system: clear breath sounds b/l Cardiovascular system: S1 & S2+. No gallops or rubs Gastrointestinal system: Abd is soft, NT, ND & hypoactive bowel sounds Central nervous system: alert & awake.  Psychiatry: Judgement and insight appears improved. Anxious mood and affect    Data Reviewed: I have personally reviewed following labs and imaging studies  CBC: Recent Labs  Lab 12/31/23 0420 01/02/24 0505 01/03/24 0529  WBC 15.4* 7.5 5.6  HGB 17.2* 15.2 16.8  HCT 48.7 44.0 48.8  MCV 88.2 89.4 88.9  PLT 230 156 163   Basic Metabolic Panel: Recent Labs  Lab 12/31/23 0420 01/02/24 0505 01/03/24 0529  NA 141 136 134*  K 3.8 4.0 4.7  CL 104 104 103  CO2 21* 23 22  GLUCOSE 135* 91 88  BUN 13 10 12   CREATININE 1.59* 0.98 0.89  CALCIUM 8.2* 8.6* 8.9  MG  --  2.3 2.5*   GFR: Estimated Creatinine Clearance: 101.5 mL/min (by C-G formula based on SCr of 0.89 mg/dL). Liver Function Tests: Recent Labs  Lab 12/31/23 0420 01/02/24 0505 01/03/24 0529  AST 51* 59* 71*  ALT 43 44 63*  ALKPHOS 82 64 71  BILITOT 0.4 0.6 0.6  PROT 6.9 6.4* 6.9  ALBUMIN 4.2 3.9 4.2   Recent Labs  Lab 12/31/23 0420 01/01/24 0610  LIPASE 115* 96*   Recent Labs  Lab 01/01/24 0108  AMMONIA 29   Coagulation Profile: No results for input(s): INR, PROTIME in the last 168 hours. Cardiac Enzymes: No results for input(s): CKTOTAL, CKMB, CKMBINDEX, TROPONINI in the last 168 hours. BNP (last 3 results) No results for input(s): PROBNP in the last 8760 hours. HbA1C: No results for input(s): HGBA1C in the last 72 hours. CBG: No results for input(s): GLUCAP in the last 168 hours. Lipid Profile: No results for input(s): CHOL, HDL, LDLCALC, TRIG, CHOLHDL, LDLDIRECT in the last 72 hours. Thyroid Function Tests: No results for input(s): TSH, T4TOTAL, FREET4, T3FREE, THYROIDAB in the last 72  hours.  Anemia Panel: No results for input(s): VITAMINB12, FOLATE, FERRITIN, TIBC, IRON, RETICCTPCT in the last 72 hours.  Sepsis Labs: No results for input(s): PROCALCITON, LATICACIDVEN in the last 168 hours.  Recent Results (from the past 240 hours)  Respiratory (~20 pathogens) panel by PCR     Status: None   Collection Time: 01/01/24 11:36 AM   Specimen: Nasopharyngeal Swab; Respiratory  Result Value Ref Range Status   Adenovirus NOT DETECTED NOT DETECTED Final   Coronavirus 229E NOT DETECTED NOT DETECTED Final    Comment: (NOTE) The Coronavirus on the Respiratory Panel, DOES NOT test for the novel  Coronavirus (2019 nCoV)    Coronavirus HKU1 NOT DETECTED NOT DETECTED Final   Coronavirus NL63 NOT DETECTED NOT DETECTED Final   Coronavirus OC43 NOT DETECTED NOT DETECTED Final   Metapneumovirus NOT DETECTED NOT DETECTED Final   Rhinovirus / Enterovirus NOT DETECTED NOT DETECTED Final   Influenza A NOT DETECTED NOT DETECTED Final   Influenza B NOT DETECTED NOT DETECTED Final   Parainfluenza Virus 1 NOT DETECTED NOT DETECTED Final   Parainfluenza Virus 2 NOT DETECTED NOT DETECTED Final   Parainfluenza Virus 3 NOT DETECTED NOT DETECTED Final   Parainfluenza Virus 4 NOT DETECTED NOT DETECTED Final   Respiratory Syncytial Virus NOT DETECTED NOT DETECTED Final   Bordetella pertussis NOT DETECTED NOT DETECTED Final   Bordetella Parapertussis NOT DETECTED NOT DETECTED Final   Chlamydophila pneumoniae NOT DETECTED NOT DETECTED Final   Mycoplasma pneumoniae NOT DETECTED NOT DETECTED Final    Comment: Performed at [facility], [address] [place], [place] [address]         Radiology Studies: DG Chest 2 View Result Date: 01/01/2024 CLINICAL DATA:  Pneumonia EXAM: CHEST - 2 VIEW COMPARISON:  Chest x-ray performed December 31, 2023 FINDINGS: There is improved aeration of the lung. The previously observed triangular opacity is less distinct. Heart mediastinum  are not significantly changed. No significant pleural effusion or pneumothorax. IMPRESSION: 1. Improved aeration of the lung. 2. Less distinct appearance of triangular opacity in the right mid lung zone. Differential considerations include linear atelectasis (favored), developing infiltrate, and pulmonary scarring. Electronically Signed   By: Maude Naegeli M.D.   On: 01/01/2024 11:21        Scheduled Meds:  enoxaparin (LOVENOX) injection  40 mg Subcutaneous Q24H   folic acid  1 mg Oral Daily   LORazepam  0-4 mg Intravenous Q12H   Or   LORazepam  0-4 mg Oral Q12H   LORazepam  2 mg Intravenous Once   multivitamin with minerals  1 tablet Oral Daily   phenobarbital  64.8 mg Oral Q8H   Followed by   NOREEN ON 01/05/2024] phenobarbital  32.4 mg Oral Q8H   [START ON 01/08/2024] thiamine (VITAMIN B1) injection  100 mg Intravenous Daily   Continuous Infusions:  thiamine (VITAMIN  B1) injection 250 mg (01/02/24 1336)     LOS: 3 days      Anthony CHRISTELLA Pouch, MD [facility] Pager [phone]  If 7PM-7AM, please contact night-coverage [email or web address] 01/03/2024, 9:25 AM

## 2024-01-04 LAB — CBC
HCT: 46.5 % (ref 39.0–52.0)
Hemoglobin: 16.3 g/dL (ref 13.0–17.0)
MCH: 31 pg (ref 26.0–34.0)
MCHC: 35.1 g/dL (ref 30.0–36.0)
MCV: 88.6 fL (ref 80.0–100.0)
Platelets: 162 K/uL (ref 150–400)
RBC: 5.25 MIL/uL (ref 4.22–5.81)
RDW: 13.3 % (ref 11.5–15.5)
WBC: 6.9 K/uL (ref 4.0–10.5)
nRBC: 0 % (ref 0.0–0.2)

## 2024-01-04 LAB — COMPREHENSIVE METABOLIC PANEL WITH GFR
ALT: 57 U/L — ABNORMAL HIGH (ref 0–44)
AST: 51 U/L — ABNORMAL HIGH (ref 15–41)
Albumin: 4 g/dL (ref 3.5–5.0)
Alkaline Phosphatase: 69 U/L (ref 38–126)
Anion gap: 9 (ref 5–15)
BUN: 14 mg/dL (ref 6–20)
CO2: 24 mmol/L (ref 22–32)
Calcium: 8.8 mg/dL — ABNORMAL LOW (ref 8.9–10.3)
Chloride: 101 mmol/L (ref 98–111)
Creatinine, Ser: 0.84 mg/dL (ref 0.61–1.24)
GFR, Estimated: >60 mL/min (ref >60)
Glucose, Bld: 82 mg/dL (ref 70–99)
Potassium: 4.2 mmol/L (ref 3.5–5.1)
Sodium: 134 mmol/L — ABNORMAL LOW (ref 135–145)
Total Bilirubin: 0.7 mg/dL (ref 0.0–1.2)
Total Protein: 6.7 g/dL (ref 6.5–8.1)

## 2024-01-04 LAB — MAGNESIUM: Magnesium: 2.4 mg/dL (ref 1.7–2.4)

## 2024-01-04 MED ORDER — CHLORDIAZEPOXIDE HCL 25 MG PO CAPS: 25.0000 mg | ORAL_CAPSULE | Freq: Three times a day (TID) | ORAL | 0 refills | Status: DC | PRN

## 2024-01-04 MED ORDER — ADULT MULTIVITAMIN W/MINERALS CH: 1.0000 | ORAL_TABLET | Freq: Every day | ORAL | Status: DC

## 2024-01-04 MED ORDER — ORAL CARE MOUTH RINSE: 15.0000 mL | OROMUCOSAL | Status: DC | PRN

## 2024-01-04 NOTE — Discharge Summary (Signed)
 Physician Discharge Summary  Kaitlin Ardito FMW:[id number] DOB: [birth year] DOA: 12/31/2023  PCP: Pcp, No  Admit date: 12/31/2023 Discharge date: 01/04/2024  Admitted From: home Disposition:  home   Recommendations for Outpatient Follow-up:  Follow up with PCP in 1-2 weeks   Home Health: no  Equipment/Devices:  Discharge Condition: stable  CODE STATUS: full  Diet recommendation:  Regular  Brief/Interim Summary: HPI was taken from Dr. Laurita: Anthony Frank is a 48 y.o. male with medical history significant of alcohol abuse, brought in by family member for evaluation of altered mentations.   Family at bedside gave history.  Patient is unknown binge drinker, he traveled to [place] for the last week and came back on Friday.  On same day, wife started noticed patient  must be drunk as his gait is unsteady.  In the next 2 days, wife was at work and did not attend for him.  By Monday, patient was found to be more confused and unsteady, and complaining about feeling nausea.  Yesterday, mentation now improving, and patient became more sleepy and more confused.  Family became more concerned and brought him to ED last night.  Patient complained about headache, and feeling nausea but denied any weakness numbness of any of the limbs.  No neck pain, no fever or chills.  No urinary symptoms or diarrhea. ED Course: Afebrile, tachycardia heart rate 100-1 20s, blood pressure 130/70 O2 saturation 100% room air.  Blood work showed negative alcohol level, WBC 15.1 hemoglobin 17.2, BUN 13 creatinine 1.5.   Patient was given 1 dose of 2 mg of Ativan and became obtunded  Discharge Diagnoses:  Principal Problem:   Acute hyperactive alcohol withdrawal delirium (HCC) Active Problems:   AMS (altered mental status) Acute metabolic encephalopathy: likely secondary to alcohol w/drawl. Hx of binge drinking as per pt's family. Re-orient prn. CT head and MRI brain showed no acute intracranial  abnormalities. EEG neg for seizures. Mental status is back to baseline. Neuro recs apprec    Alcohol withdrawal: received alcohol cessation counseling already.  Still scoring on CIWA protocol, phenobarb taper & high dose of thiamine.  Pt wants to stop drinking and agreed to take outpatient resources given by CM    SIRS: likely reactive. No signs of infection currently. Viral PCR panel is neg. Resolved   Gait instability: PT recs outpatient PT    Discharge Instructions  Discharge Instructions     Diet general   Complete by: As directed    Discharge instructions   Complete by: As directed    F/u w/ PCP in 1-2 weeks   Increase activity slowly   Complete by: As directed       Allergies as of 01/04/2024   No Known Allergies      Medication List     TAKE these medications    chlordiazePOXIDE 25 MG capsule Commonly known as: LIBRIUM Take 1 capsule (25 mg total) by mouth 3 (three) times daily as needed for up to 5 days for anxiety or withdrawal.   multivitamin with minerals Tabs tablet Take 1 tablet by mouth daily. Start taking on: January 05, 2024        No Known Allergies  Consultations: neuro   Procedures/Studies: DG Chest 2 View Result Date: 01/01/2024 CLINICAL DATA:  Pneumonia EXAM: CHEST - 2 VIEW COMPARISON:  Chest x-ray performed December 31, 2023 FINDINGS: There is improved aeration of the lung. The previously observed triangular opacity is less distinct. Heart mediastinum are not significantly changed. No  significant pleural effusion or pneumothorax. IMPRESSION: 1. Improved aeration of the lung. 2. Less distinct appearance of triangular opacity in the right mid lung zone. Differential considerations include linear atelectasis (favored), developing infiltrate, and pulmonary scarring. Electronically Signed   By: Maude Naegeli M.D.   On: 01/01/2024 11:21   EEG adult Result Date: 12/31/2023 Shelton Arlin KIDD, MD     12/31/2023  4:32 PM Patient Name: [name] MRN: [id number] Epilepsy Attending: Arlin KIDD Shelton Referring Physician/Provider: Laurita Cort DASEN, MD Date: 12/31/2023 Duration: 26.22 mins Patient history: 48yo M with ams. EEG to evaluate for seizure Level of alertness: Awake, asleep AEDs during EEG study: Ativan Technical aspects: This EEG study was done with scalp electrodes positioned according to the 10-20 International system of electrode placement. Electrical activity was reviewed with band pass filter of 1-70Hz , sensitivity of 7 uV/mm, display speed of 31mm/sec with a 60Hz  notched filter applied as appropriate. EEG data were recorded continuously and digitally stored.  Video monitoring was available and reviewed as appropriate. Description: The posterior dominant rhythm consists of 9-10 Hz activity of moderate voltage (25-35 uV) seen predominantly in posterior head regions, symmetric and reactive to eye opening and eye closing. Sleep was characterized by vertex waves, sleep spindles (12 to 14 Hz), maximal frontocentral region. There is an excessive amount of 15 to 18 Hz beta activity distributed symmetrically and diffusely. EEG showed intermittent generalized 3 to 6 Hz theta-delta slowing. Hyperventilation and photic stimulation were not performed.   ABNORMALITY - Intermittent slow, generalized - Excessive beta, generalized IMPRESSION: This study is suggestive of mild generalized cerebral dysfunction (encephalopathy). The excessive beta activity seen in the background is most likely due to the effect of benzodiazepine and is a benign EEG pattern. No seizures or epileptiform discharges were seen throughout the recording. Arlin KIDD Shelton ]  MR BRAIN WO CONTRAST Result Date: 12/31/2023 EXAM: MRI BRAIN WITHOUT CONTRAST 12/31/2023 11:55:00 AM TECHNIQUE: Multiplanar multisequence MRI of the head/brain was performed without the administration of intravenous contrast. COMPARISON: Head CT 12/31/2023 05:43:00 AM. CLINICAL HISTORY: 48 year old male  with altered mental status. FINDINGS: BRAIN AND VENTRICLES: No acute infarct. No intracranial hemorrhage. No mass. No midline shift. No hydrocephalus. Normal brain volume. Normal for age gray and white matter signal. No cortical encephalomalacia or chronic cerebral blood products. Deep gray nuclei, brainstem, and cerebellum appear normal. Distal left vertebral artery flow void appears to be dominant, normal variant. The sella is unremarkable. Normal flow voids. ORBITS: No acute abnormality. SINUSES AND MASTOIDS: No acute abnormality. BONES AND SOFT TISSUES: Normal marrow signal. No acute soft tissue abnormality. Normal visible cervical spine. IMPRESSION: 1. Normal for age non-contrast MRI appearance of the brain. Electronically signed by: Helayne Hurst MD 12/31/2023 12:38 PM EST RP Workstation: [id number]   DG Chest 1 View Result Date: 12/31/2023 CLINICAL DATA:  Altered mental status. EXAM: CHEST  1 VIEW COMPARISON:  None. FINDINGS: Lungs are hypoinflated without lobar consolidation or effusion. Triangular opacity over the right lower lung which may be artifactual. Cardiomediastinal silhouette and remainder of the exam is unremarkable. IMPRESSION: 1. Hypoinflation without acute cardiopulmonary disease. 2. Triangular opacity over the right lower lung which may be artifactual. Recommend repeat PA and lateral chest x-ray. Electronically Signed   By: Toribio Agreste M.D.   On: 12/31/2023 09:36   CT HEAD WO CONTRAST Result Date: 12/31/2023 CLINICAL DATA:  Altered mental status. EXAM: CT HEAD WITHOUT CONTRAST TECHNIQUE: Contiguous axial images were obtained from the base of the skull through the vertex  without intravenous contrast. RADIATION DOSE REDUCTION: This exam was performed according to the departmental dose-optimization program which includes automated exposure control, adjustment of the mA and/or kV according to patient size and/or use of iterative reconstruction technique. COMPARISON:  No comparison studies  available. FINDINGS: Brain: There is no evidence for acute hemorrhage, hydrocephalus, mass lesion, or abnormal extra-axial fluid collection. No definite CT evidence for acute infarction. Vascular: No hyperdense vessel or unexpected calcification. Skull: No evidence for fracture. No worrisome lytic or sclerotic lesion. Sinuses/Orbits: The visualized paranasal sinuses and mastoid air cells are clear. Visualized portions of the globes and intraorbital fat are unremarkable. Other: None. IMPRESSION: No acute intracranial abnormality. Electronically Signed   By: Camellia Candle M.D.   On: 12/31/2023 05:50   (Echo, Carotid, EGD, Colonoscopy, ERCP)    Subjective: Pt c/o anxiety    Discharge Exam: Vitals:   01/04/24 0200 01/04/24 0500  BP: 115/81 116/83  Pulse: 74 71  Resp:  17  Temp:  (!) 97.5 F (36.4 C)  SpO2:  97%   Vitals:   01/03/24 2035 01/04/24 0123 01/04/24 0200 01/04/24 0500  BP: 119/72 115/81 115/81 116/83  Pulse: 94 74 74 71  Resp:  17  17  Temp:  98.4 F (36.9 C)  (!) 97.5 F (36.4 C)  TempSrc:  Oral  Oral  SpO2:  97%  97%  Weight:      Height:        General: Pt is alert, awake, not in acute distress Cardiovascular:  S1/S2 +, no rubs, no gallops Respiratory: CTA bilaterally, no wheezing, no rhonchi Abdominal: Soft, NT, ND, bowel sounds + Extremities: no edema, no cyanosis    The results of significant diagnostics from this hospitalization (including imaging, microbiology, ancillary and laboratory) are listed below for reference.     Microbiology: Recent Results (from the past 240 hours)  Respiratory (~20 pathogens) panel by PCR     Status: None   Collection Time: 01/01/24 11:36 AM   Specimen: Nasopharyngeal Swab; Respiratory  Result Value Ref Range Status   Adenovirus NOT DETECTED NOT DETECTED Final   Coronavirus 229E NOT DETECTED NOT DETECTED Final    Comment: (NOTE) The Coronavirus on the Respiratory Panel, DOES NOT test for the novel  Coronavirus (2019  nCoV)    Coronavirus HKU1 NOT DETECTED NOT DETECTED Final   Coronavirus NL63 NOT DETECTED NOT DETECTED Final   Coronavirus OC43 NOT DETECTED NOT DETECTED Final   Metapneumovirus NOT DETECTED NOT DETECTED Final   Rhinovirus / Enterovirus NOT DETECTED NOT DETECTED Final   Influenza A NOT DETECTED NOT DETECTED Final   Influenza B NOT DETECTED NOT DETECTED Final   Parainfluenza Virus 1 NOT DETECTED NOT DETECTED Final   Parainfluenza Virus 2 NOT DETECTED NOT DETECTED Final   Parainfluenza Virus 3 NOT DETECTED NOT DETECTED Final   Parainfluenza Virus 4 NOT DETECTED NOT DETECTED Final   Respiratory Syncytial Virus NOT DETECTED NOT DETECTED Final   Bordetella pertussis NOT DETECTED NOT DETECTED Final   Bordetella Parapertussis NOT DETECTED NOT DETECTED Final   Chlamydophila pneumoniae NOT DETECTED NOT DETECTED Final   Mycoplasma pneumoniae NOT DETECTED NOT DETECTED Final    Comment: Performed at [facility], [address], [place], [place] [address]     Labs: BNP (last 3 results) No results for input(s): BNP in the last 8760 hours. Basic Metabolic Panel: Recent Labs  Lab 12/31/23 0420 01/02/24 0505 01/03/24 0529 01/04/24 0529  NA 141 136 134* 134*  K 3.8  4.0 4.7 4.2  CL 104 104 103 101  CO2 21* 23 22 24   GLUCOSE 135* 91 88 82  BUN 13 10 12 14   CREATININE 1.59* 0.98 0.89 0.84  CALCIUM 8.2* 8.6* 8.9 8.8*  MG  --  2.3 2.5* 2.4   Liver Function Tests: Recent Labs  Lab 12/31/23 0420 01/02/24 0505 01/03/24 0529 01/04/24 0529  AST 51* 59* 71* 51*  ALT 43 44 63* 57*  ALKPHOS 82 64 71 69  BILITOT 0.4 0.6 0.6 0.7  PROT 6.9 6.4* 6.9 6.7  ALBUMIN 4.2 3.9 4.2 4.0   Recent Labs  Lab 12/31/23 0420 01/01/24 0610  LIPASE 115* 96*   Recent Labs  Lab 01/01/24 0108  AMMONIA 29   CBC: Recent Labs  Lab 12/31/23 0420 01/02/24 0505 01/03/24 0529 01/04/24 0529  WBC 15.4* 7.5 5.6 6.9  HGB 17.2* 15.2 16.8 16.3  HCT 48.7 44.0 48.8 46.5  MCV 88.2 89.4 88.9 88.6  PLT  230 156 163 162   Cardiac Enzymes: No results for input(s): CKTOTAL, CKMB, CKMBINDEX, TROPONINI in the last 168 hours. BNP: Invalid input(s): POCBNP CBG: No results for input(s): GLUCAP in the last 168 hours. D-Dimer No results for input(s): DDIMER in the last 72 hours. Hgb A1c No results for input(s): HGBA1C in the last 72 hours. Lipid Profile No results for input(s): CHOL, HDL, LDLCALC, TRIG, CHOLHDL, LDLDIRECT in the last 72 hours. Thyroid function studies No results for input(s): TSH, T4TOTAL, T3FREE, THYROIDAB in the last 72 hours.  Invalid input(s): FREET3 Anemia work up No results for input(s): VITAMINB12, FOLATE, FERRITIN, TIBC, IRON, RETICCTPCT in the last 72 hours. Urinalysis    Component Value Date/Time   COLORURINE YELLOW (A) 12/31/2023 1821   APPEARANCEUR CLEAR (A) 12/31/2023 1821   LABSPEC 1.021 12/31/2023 1821   PHURINE 7.0 12/31/2023 1821   GLUCOSEU NEGATIVE 12/31/2023 1821   HGBUR NEGATIVE 12/31/2023 1821   BILIRUBINUR NEGATIVE 12/31/2023 1821   KETONESUR 20 (A) 12/31/2023 1821   PROTEINUR NEGATIVE 12/31/2023 1821   NITRITE NEGATIVE 12/31/2023 1821   LEUKOCYTESUR NEGATIVE 12/31/2023 1821   Sepsis Labs Recent Labs  Lab 12/31/23 0420 01/02/24 0505 01/03/24 0529 01/04/24 0529  WBC 15.4* 7.5 5.6 6.9   Microbiology Recent Results (from the past 240 hours)  Respiratory (~20 pathogens) panel by PCR     Status: None   Collection Time: 01/01/24 11:36 AM   Specimen: Nasopharyngeal Swab; Respiratory  Result Value Ref Range Status   Adenovirus NOT DETECTED NOT DETECTED Final   Coronavirus 229E NOT DETECTED NOT DETECTED Final    Comment: (NOTE) The Coronavirus on the Respiratory Panel, DOES NOT test for the novel  Coronavirus (2019 nCoV)    Coronavirus HKU1 NOT DETECTED NOT DETECTED Final   Coronavirus NL63 NOT DETECTED NOT DETECTED Final   Coronavirus OC43 NOT DETECTED NOT DETECTED Final    Metapneumovirus NOT DETECTED NOT DETECTED Final   Rhinovirus / Enterovirus NOT DETECTED NOT DETECTED Final   Influenza A NOT DETECTED NOT DETECTED Final   Influenza B NOT DETECTED NOT DETECTED Final   Parainfluenza Virus 1 NOT DETECTED NOT DETECTED Final   Parainfluenza Virus 2 NOT DETECTED NOT DETECTED Final   Parainfluenza Virus 3 NOT DETECTED NOT DETECTED Final   Parainfluenza Virus 4 NOT DETECTED NOT DETECTED Final   Respiratory Syncytial Virus NOT DETECTED NOT DETECTED Final   Bordetella pertussis NOT DETECTED NOT DETECTED Final   Bordetella Parapertussis NOT DETECTED NOT DETECTED Final   Chlamydophila pneumoniae NOT DETECTED NOT DETECTED  Final   Mycoplasma pneumoniae NOT DETECTED NOT DETECTED Final    Comment: Performed at [facility], [address] [place], [place] [address]     Time coordinating discharge: 39 minutes  SIGNED:   Anthony CHRISTELLA Pouch, MD  [facility] 01/04/2024, 12:27 PM Pager   If 7PM-7AM, please contact night-coverage [email or web address]

## 2024-01-04 NOTE — Plan of Care (Signed)

## 2024-01-07 ENCOUNTER — Other Ambulatory Visit: Payer: Self-pay

## 2024-01-07 ENCOUNTER — Emergency Department: Payer: Self-pay

## 2024-01-07 DIAGNOSIS — R45851 Suicidal ideations: Secondary | ICD-10-CM | POA: Insufficient documentation

## 2024-01-07 DIAGNOSIS — R55 Syncope and collapse: Secondary | ICD-10-CM | POA: Insufficient documentation

## 2024-01-07 DIAGNOSIS — F39 Unspecified mood [affective] disorder: Secondary | ICD-10-CM | POA: Insufficient documentation

## 2024-01-07 DIAGNOSIS — F411 Generalized anxiety disorder: Secondary | ICD-10-CM | POA: Insufficient documentation

## 2024-01-07 DIAGNOSIS — R42 Dizziness and giddiness: Secondary | ICD-10-CM | POA: Insufficient documentation

## 2024-01-07 DIAGNOSIS — N179 Acute kidney failure, unspecified: Secondary | ICD-10-CM | POA: Insufficient documentation

## 2024-01-07 DIAGNOSIS — G4700 Insomnia, unspecified: Secondary | ICD-10-CM | POA: Insufficient documentation

## 2024-01-07 DIAGNOSIS — Z79899 Other long term (current) drug therapy: Secondary | ICD-10-CM | POA: Insufficient documentation

## 2024-01-07 DIAGNOSIS — F32A Depression, unspecified: Secondary | ICD-10-CM | POA: Insufficient documentation

## 2024-01-07 DIAGNOSIS — W06XXXA Fall from bed, initial encounter: Secondary | ICD-10-CM | POA: Insufficient documentation

## 2024-01-07 LAB — CBC
HCT: 49 % (ref 39.0–52.0)
Hemoglobin: 16.4 g/dL (ref 13.0–17.0)
MCH: 30.9 pg (ref 26.0–34.0)
MCHC: 33.5 g/dL (ref 30.0–36.0)
MCV: 92.5 fL (ref 80.0–100.0)
Platelets: 227 K/uL (ref 150–400)
RBC: 5.3 MIL/uL (ref 4.22–5.81)
RDW: 14.6 % (ref 11.5–15.5)
WBC: 11.4 K/uL — ABNORMAL HIGH (ref 4.0–10.5)
nRBC: 0 % (ref 0.0–0.2)

## 2024-01-07 LAB — COMPREHENSIVE METABOLIC PANEL WITH GFR
ALT: 45 U/L — ABNORMAL HIGH (ref 0–44)
AST: 32 U/L (ref 15–41)
Albumin: 4.5 g/dL (ref 3.5–5.0)
Alkaline Phosphatase: 77 U/L (ref 38–126)
Anion gap: 15 (ref 5–15)
BUN: 8 mg/dL (ref 6–20)
CO2: 25 mmol/L (ref 22–32)
Calcium: 8.7 mg/dL — ABNORMAL LOW (ref 8.9–10.3)
Chloride: 104 mmol/L (ref 98–111)
Creatinine, Ser: 1.49 mg/dL — ABNORMAL HIGH (ref 0.61–1.24)
GFR, Estimated: 58 mL/min — ABNORMAL LOW (ref >60)
Glucose, Bld: 111 mg/dL — ABNORMAL HIGH (ref 70–99)
Potassium: 4.4 mmol/L (ref 3.5–5.1)
Sodium: 143 mmol/L (ref 135–145)
Total Bilirubin: <0.2 mg/dL (ref 0.0–1.2)
Total Protein: 7.5 g/dL (ref 6.5–8.1)

## 2024-01-07 LAB — URINALYSIS, ROUTINE W REFLEX MICROSCOPIC
Bilirubin Urine: NEGATIVE
Glucose, UA: NEGATIVE mg/dL
Hgb urine dipstick: NEGATIVE
Ketones, ur: 20 mg/dL — AB
Leukocytes,Ua: NEGATIVE
Nitrite: NEGATIVE
Protein, ur: NEGATIVE mg/dL
Specific Gravity, Urine: 1.017 (ref 1.005–1.030)
pH: 6 (ref 5.0–8.0)

## 2024-01-07 LAB — TROPONIN T, HIGH SENSITIVITY
Troponin T High Sensitivity: <15 ng/L (ref 0–19)
Troponin T High Sensitivity: <15 ng/L (ref 0–19)

## 2024-01-07 LAB — ETHANOL: Alcohol, Ethyl (B): <15 mg/dL (ref <15)

## 2024-01-07 NOTE — ED Triage Notes (Signed)
 Pt arrived via POV from home after a fall out of bed this morning and weakness today. Per spouse, pt has been lethargic today- falling asleep while trying to eat. Pt is a poor historian. Pt denies etoh, drug usage. Pt was here last week for a stroke and per spouse pt has been better until the fall today. Pt endorses dizziness, HA and lightheaded. Pt is A&Ox4 but slow to come up with answers.

## 2024-01-07 NOTE — ED Notes (Signed)
 Red top sent in case if needed.

## 2024-01-08 ENCOUNTER — Inpatient Hospital Stay
Admission: RE | Admit: 2024-01-08 | Discharge: 2024-01-11 | DRG: 880 | Disposition: A | Discharge status: Home / Self Care | Payer: 59 | Source: Intra-hospital | Attending: Child & Adolescent Psychiatry | Admitting: Child & Adolescent Psychiatry

## 2024-01-08 ENCOUNTER — Emergency Department
Admission: EM | Admit: 2024-01-08 | Discharge: 2024-01-08 | Disposition: A | Discharge status: Other Acute Inpatient Hospital | Payer: 59 | Attending: Emergency Medicine | Admitting: Emergency Medicine

## 2024-01-08 DIAGNOSIS — F17290 Nicotine dependence, other tobacco product, uncomplicated: Secondary | ICD-10-CM | POA: Diagnosis present

## 2024-01-08 DIAGNOSIS — Z566 Other physical and mental strain related to work: Secondary | ICD-10-CM | POA: Diagnosis not present

## 2024-01-08 DIAGNOSIS — G479 Sleep disorder, unspecified: Secondary | ICD-10-CM | POA: Diagnosis present

## 2024-01-08 DIAGNOSIS — F411 Generalized anxiety disorder: Secondary | ICD-10-CM | POA: Diagnosis present

## 2024-01-08 DIAGNOSIS — F1090 Alcohol use, unspecified, uncomplicated: Secondary | ICD-10-CM | POA: Diagnosis present

## 2024-01-08 DIAGNOSIS — Z23 Encounter for immunization: Secondary | ICD-10-CM

## 2024-01-08 DIAGNOSIS — Z599 Problem related to housing and economic circumstances, unspecified: Secondary | ICD-10-CM

## 2024-01-08 DIAGNOSIS — Z635 Disruption of family by separation and divorce: Secondary | ICD-10-CM | POA: Diagnosis not present

## 2024-01-08 DIAGNOSIS — R45851 Suicidal ideations: Secondary | ICD-10-CM

## 2024-01-08 DIAGNOSIS — Z79899 Other long term (current) drug therapy: Secondary | ICD-10-CM

## 2024-01-08 LAB — URINE DRUG SCREEN
Amphetamines: NEGATIVE
Barbiturates: POSITIVE — AB
Benzodiazepines: POSITIVE — AB
Cocaine: NEGATIVE
Fentanyl: NEGATIVE
Methadone Scn, Ur: NEGATIVE
Opiates: NEGATIVE
Tetrahydrocannabinol: NEGATIVE

## 2024-01-08 MED ORDER — MAGNESIUM HYDROXIDE 400 MG/5ML PO SUSP
30.0000 mL | Freq: Every day | ORAL | Status: DC | PRN
Start: 2024-01-08 — End: 2024-01-11

## 2024-01-08 MED ORDER — THIAMINE MONONITRATE 100 MG PO TABS
100.0000 mg | ORAL_TABLET | Freq: Every day | ORAL | Status: DC
  Administered 2024-01-09 – 2024-01-11 (×3): 100 mg via ORAL
  Filled 2024-01-08 (×3): qty 1

## 2024-01-08 MED ORDER — ADULT MULTIVITAMIN W/MINERALS CH
1.0000 | ORAL_TABLET | Freq: Every day | ORAL | Status: DC
  Administered 2024-01-08: 1 via ORAL
  Filled 2024-01-08: qty 1

## 2024-01-08 MED ORDER — FOLIC ACID 1 MG PO TABS
1.0000 mg | ORAL_TABLET | Freq: Every day | ORAL | Status: DC
  Administered 2024-01-09 – 2024-01-11 (×3): 1 mg via ORAL
  Filled 2024-01-08 (×3): qty 1

## 2024-01-08 MED ORDER — HYDROXYZINE HCL 25 MG PO TABS
25.0000 mg | ORAL_TABLET | Freq: Three times a day (TID) | ORAL | Status: DC | PRN
  Administered 2024-01-08 – 2024-01-09 (×2): 25 mg via ORAL
  Filled 2024-01-08 (×2): qty 1

## 2024-01-08 MED ORDER — LOPERAMIDE HCL 2 MG PO CAPS: 2.0000 mg | ORAL_CAPSULE | ORAL | Status: DC | PRN

## 2024-01-08 MED ORDER — ACETAMINOPHEN 325 MG PO TABS
650.0000 mg | ORAL_TABLET | Freq: Four times a day (QID) | ORAL | Status: DC | PRN
  Administered 2024-01-09 – 2024-01-10 (×5): 650 mg via ORAL
  Filled 2024-01-08 (×5): qty 2

## 2024-01-08 MED ORDER — TRAZODONE HCL 50 MG PO TABS
50.0000 mg | ORAL_TABLET | Freq: Every evening | ORAL | Status: DC | PRN
  Administered 2024-01-09 – 2024-01-10 (×2): 50 mg via ORAL
  Filled 2024-01-08 (×2): qty 1

## 2024-01-08 MED ORDER — LORAZEPAM 2 MG/ML IJ SOLN: 2.0000 mg | Freq: Three times a day (TID) | INTRAMUSCULAR | Status: DC | PRN

## 2024-01-08 MED ORDER — HALOPERIDOL LACTATE 5 MG/ML IJ SOLN: 10.0000 mg | Freq: Three times a day (TID) | INTRAMUSCULAR | Status: DC | PRN

## 2024-01-08 MED ORDER — HALOPERIDOL LACTATE 5 MG/ML IJ SOLN
5.0000 mg | Freq: Three times a day (TID) | INTRAMUSCULAR | Status: DC | PRN
Start: 2024-01-08 — End: 2024-01-11

## 2024-01-08 MED ORDER — DIPHENHYDRAMINE HCL 25 MG PO CAPS: 50.0000 mg | ORAL_CAPSULE | Freq: Three times a day (TID) | ORAL | Status: DC | PRN

## 2024-01-08 MED ORDER — DIPHENHYDRAMINE HCL 50 MG/ML IJ SOLN: 50.0000 mg | Freq: Three times a day (TID) | INTRAMUSCULAR | Status: DC | PRN

## 2024-01-08 MED ORDER — FOLIC ACID 1 MG PO TABS
1.0000 mg | ORAL_TABLET | Freq: Every day | ORAL | Status: DC
  Administered 2024-01-08: 1 mg via ORAL
  Filled 2024-01-08: qty 1

## 2024-01-08 MED ORDER — THIAMINE MONONITRATE 100 MG PO TABS
100.0000 mg | ORAL_TABLET | Freq: Every day | ORAL | Status: DC
  Administered 2024-01-08: 100 mg via ORAL
  Filled 2024-01-08: qty 1

## 2024-01-08 MED ORDER — ADULT MULTIVITAMIN W/MINERALS CH: 1.0000 | ORAL_TABLET | Freq: Every day | ORAL | Status: DC

## 2024-01-08 MED ORDER — THIAMINE HCL 100 MG/ML IJ SOLN: 100.0000 mg | Freq: Every day | INTRAMUSCULAR | Status: DC

## 2024-01-08 MED ORDER — ALUM & MAG HYDROXIDE-SIMETH 200-200-20 MG/5ML PO SUSP: 30.0000 mL | ORAL | Status: DC | PRN

## 2024-01-08 MED ORDER — ADULT MULTIVITAMIN W/MINERALS CH
1.0000 | ORAL_TABLET | Freq: Every day | ORAL | Status: DC
  Administered 2024-01-09 – 2024-01-11 (×3): 1 via ORAL
  Filled 2024-01-08 (×3): qty 1

## 2024-01-08 MED ORDER — LORAZEPAM 1 MG PO TABS
1.0000 mg | ORAL_TABLET | ORAL | Status: DC | PRN
  Administered 2024-01-08: 1 mg via ORAL
  Administered 2024-01-08: 2 mg via ORAL
  Administered 2024-01-08 (×2): 1 mg via ORAL
  Filled 2024-01-08 (×3): qty 1
  Filled 2024-01-08: qty 2

## 2024-01-08 MED ORDER — CHLORDIAZEPOXIDE HCL 25 MG PO CAPS
25.0000 mg | ORAL_CAPSULE | Freq: Four times a day (QID) | ORAL | Status: DC | PRN
Start: 2024-01-08 — End: 2024-01-11

## 2024-01-08 MED ORDER — HALOPERIDOL 5 MG PO TABS: 5.0000 mg | ORAL_TABLET | Freq: Three times a day (TID) | ORAL | Status: DC | PRN

## 2024-01-08 MED ORDER — HYDROXYZINE HCL 25 MG PO TABS
25.0000 mg | ORAL_TABLET | Freq: Once | ORAL | Status: AC
  Administered 2024-01-08: 25 mg via ORAL
  Filled 2024-01-08: qty 1

## 2024-01-08 MED ORDER — ONDANSETRON 4 MG PO TBDP: 4.0000 mg | ORAL_TABLET | Freq: Four times a day (QID) | ORAL | Status: DC | PRN

## 2024-01-08 MED ORDER — LORAZEPAM 2 MG/ML IJ SOLN: 1.0000 mg | INTRAMUSCULAR | Status: DC | PRN

## 2024-01-08 NOTE — ED Notes (Signed)
 Called Wife per patient and updated her about POC.

## 2024-01-08 NOTE — ED Provider Notes (Signed)
 [facility] Provider Note    Event Date/Time   First MD Initiated Contact with Patient 01/08/24 0101     (approximate)   History   Weakness and Fall   HPI  Anthony Frank is a 48 y.o. male   Past medical history of alcohol use, alcohol withdrawal, here with suicidal ideation and depression.  Poor sleep 1 year sleeping only several hours a night.  Was hospitalized for altered mental status found to be in acute alcohol withdrawal and did well with phenobarbital taper and was sent home on Librium taper.  Has been taking and has not been drinking or using drugs but continues to feel anxious, depressed, poor mood, sleeplessness, and passive suicidal ideation.  He denies any other acute medical complaints.  He did have a fall this morning when he felt weak, has had very poor p.o. intake, and fell struck his head.  No other acute injuries.  Independent Historian contributed to assessment above: His wife gives collateral as above  External Medical Documents Reviewed: Previous hospitalization notes      Physical Exam   Triage Vital Signs: ED Triage Vitals  Encounter Vitals Group     BP 01/07/24 1823 (!) 133/91     Girls Systolic BP Percentile --      Girls Diastolic BP Percentile --      Boys Systolic BP Percentile --      Boys Diastolic BP Percentile --      Pulse Rate 01/07/24 1823 93     Resp 01/07/24 1823 19     Temp 01/07/24 1823 98.1 F (36.7 C)     Temp Source 01/07/24 1823 Oral     SpO2 01/07/24 1823 98 %     Weight 01/07/24 1829 156 lb 8.4 oz (71 kg)     Height 01/07/24 1829 5' 9 (1.753 m)     Head Circumference --      Peak Flow --      Pain Score 01/07/24 1825 10     Pain Loc --      Pain Education --      Exclude from Growth Chart --     Most recent vital signs: Vitals:   01/07/24 2126 01/08/24 0139  BP: 137/80 117/68  Pulse: 90 73  Resp: 16 15  Temp: 98 F (36.7 C) 98 F (36.7 C)  SpO2: 98% 100%     General: Awake, no distress.  CV:  Good peripheral perfusion.  Resp:  Normal effort.  Abd:  No distention.  Other:  Awake alert pleasant gentleman conversant appropriate with normal hemodynamics.  No tremors or tongue fasciculation, no autonomic instability, normal heart rate, normal blood pressure.  Moving all extremities with full active range of motion, no facial asymmetry or dysarthria.   ED Results / Procedures / Treatments   Labs (all labs ordered are listed, but only abnormal results are displayed) Labs Reviewed  COMPREHENSIVE METABOLIC PANEL WITH GFR - Abnormal; Notable for the following components:      Result Value   Glucose, Bld 111 (*)    Creatinine, Ser 1.49 (*)    Calcium 8.7 (*)    ALT 45 (*)    GFR, Estimated 58 (*)    All other components within normal limits  CBC - Abnormal; Notable for the following components:   WBC 11.4 (*)    All other components within normal limits  URINALYSIS, ROUTINE W REFLEX MICROSCOPIC - Abnormal; Notable for the following components:  Color, Urine YELLOW (*)    APPearance CLEAR (*)    Ketones, ur 20 (*)    All other components within normal limits  ETHANOL  CBG MONITORING, ED  TROPONIN T, HIGH SENSITIVITY  TROPONIN T, HIGH SENSITIVITY     I ordered and reviewed the above labs they are notable for cell counts electrolytes unremarkable, creatinine slightly increased from prior testing.  EKG  ED ECG REPORT I, Ginnie Shams, the attending physician, personally viewed and interpreted this ECG.   Date: 01/08/2024  EKG Time: 1818  Rate: 98  Rhythm: sinus  Axis: nl  Intervals:nl  ST&T Change: no stemi, T wave inversion in inferior lead similar to prior testing    RADIOLOGY I independently reviewed and interpreted CT head and see no obvious bleeding or midline shift I also reviewed radiologist's formal read.   PROCEDURES:  Critical Care performed: No  Procedures   MEDICATIONS ORDERED IN ED: Medications   hydrOXYzine (ATARAX) tablet 25 mg (has no administration in time range)  LORazepam (ATIVAN) tablet 1-4 mg (has no administration in time range)    Or  LORazepam (ATIVAN) injection 1-4 mg (has no administration in time range)  thiamine (VITAMIN B1) tablet 100 mg (has no administration in time range)    Or  thiamine (VITAMIN B1) injection 100 mg (has no administration in time range)  folic acid (FOLVITE) tablet 1 mg (has no administration in time range)  multivitamin with minerals tablet 1 tablet (has no administration in time range)    IMPRESSION / MDM / ASSESSMENT AND PLAN / ED COURSE  I reviewed the triage vital signs and the nursing notes.                                Patient's presentation is most consistent with acute presentation with potential threat to life or bodily function.  Differential diagnosis includes, but is not limited to, alcohol withdrawal, intoxication, psychiatric illness, considered but less likely other medical etiologies like dysrhythmia, electrolyte derangements, infection, stroke   MDM:   Today with chief complaint of depression, poor sleep, suicidal ideation, passively.  Has not drank alcohol since detoxing in the hospital last hospitalization.  Does not appear to be intoxicated today nor any acute withdrawal.  No self-injurious behaviors but did have a syncopal event earlier today when he felt lightheaded fell and hit his head.  Fortunately CT head and neck unremarkable, and benign neurological exam rules against stroke.  Cell counts do show mild AKI may have contributed to his lightheadedness and syncopal episode in the setting of poor p.o. intake in the setting of his depression.  Here voluntarily.  I see no reason to IVC but he is feeling quite depressed and wants to meet with psychiatry so I have medically cleared him for psychiatric evaluation.  Again he, he states he has not been drinking alcohol and his alcohol level is undetectable at this time he  is exhibiting no signs of alcohol withdrawal.  Given his history of alcohol use and his recent withdrawal I will put him on CIWA protocol.  I will give Atarax for anxiety.       FINAL CLINICAL IMPRESSION(S) / ED DIAGNOSES   Final diagnoses:  Passive suicidal ideations     Rx / DC Orders   ED Discharge Orders     None        Note:  This document was prepared using Dragon voice recognition  software and may include unintentional dictation errors.    Cyrena Mylar, MD 01/08/24 [phone]

## 2024-01-08 NOTE — ED Notes (Signed)
 Pt's phone in a black and red case placed in a bag and kept at nursing station on black cart.

## 2024-01-08 NOTE — ED Notes (Addendum)
 This RN attempted to call report to [facility]. Staff stated they could not take patient until after 1900. Staff instructed RN to call report after 1900. Team informed of this information.

## 2024-01-08 NOTE — Consult Note (Signed)
 Psychiatry attempted assessment with patient.  Patient was able to answer orientation questions appropriately, however patient had many periods of confusion and noted to be nodding off during exam.  For example, when patient was asked about anxiety and depression patient stated that is a very good question and smiled and went back to sleep.  Psychiatry is unable to complete full assessment at this time due to current status.  We did call patient's spouse with his permission for collateral information.  She reported that when he was previously at the hospital & Discharged (01/04/2024), he was at his baseline and acting like himself.  However she noticed yesterday him being lethargic and nodding off.  It is noted in chart that patient received Ativan earlier this morning.  This could be contributing to current presentation as well as it is noted in previous notes from previous admissions, Patient was given 1 dose of 2 mg of Ativan and became obtunded  .  We did discuss concerns with medical team.  Psychiatry will attempt to assess patient later when patient more alert and able to participate.

## 2024-01-08 NOTE — Progress Notes (Addendum)
 Patient has been accepted to [facility] 01/08/24 after 7PM. Patient assigned to room 315A. Accepting physician is Dr. Jadapalle. Call report to [phone]. Representative was [facility] Linsey.   ER Staff is aware of it: [facility] ER Secretary Dr. Dicky, ER MD Cherene, RN Patient's Nurse

## 2024-01-08 NOTE — ED Notes (Signed)
 Psych in room now assessing patient.

## 2024-01-08 NOTE — ED Notes (Signed)
 Pt given meal tray.

## 2024-01-08 NOTE — Consult Note (Signed)
 [facility] Psychiatric Consult Initial  Patient Name: .Anthony Frank  MRN: [id number]  DOB: 04-26-1975  Consult Order details:  Orders (From admission, onward)     Start     Ordered   01/08/24 0242  IP CONSULT TO PSYCHIATRY       Ordering Provider: Cyrena Mylar, MD  Provider:  (Not yet assigned)  Question:  Reason for consult:  Answer:  Medication management   01/08/24 0242   01/08/24 0242  CONSULT TO CALL ACT TEAM       Ordering Provider: Cyrena Mylar, MD  Provider:  (Not yet assigned)  Question:  Reason for Consult?  Answer:  Psych consult   01/08/24 0242   01/08/24 0242  IP CONSULT TO PSYCHIATRY       Ordering Provider: Cyrena Mylar, MD  Provider:  (Not yet assigned)  Question:  Reason for consult:  Answer:  Medication management   01/08/24 0242             Mode of Visit: In person    Psychiatry Consult Evaluation  Service Date: January 08, 2024 LOS:  LOS: 0 days  Chief Complaint Feeling uneasy  Primary Psychiatric Diagnoses  Generalized anxiety disorder   Assessment   Anthony Frank is a 48 y.o. male admitted: Presented to the EDfor 01/08/2024 12:58 AM for passive suicidal ideation as well as depression and anxiety. He carries the psychiatric diagnoses of alcohol use disorder and has a past medical history of alcohol withdrawal.   On assessment today, patient more alert when this provider reassessed patient.  Patient able to stay awake for duration of assessment.  Patient with appropriate eye contact and alert and oriented x 4.  Patient reported increased anxiety, insomnia and racing thoughts.  He reported this had been ongoing for quite some time and has built up to the point where he can no longer handle it.  Patient reported symptoms have been present for at least 6 months.  He reported he will lay down at night and attempt to get rest but be unable to stop his worrying thoughts.  He reported if he has a problem that he knows that can be solved the  next day, he will still worry about it constantly that night.  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  He reported feelings of feeling overwhelmed.  He reported coming to the hospital for help with these feelings and is interested in inpatient treatment.  He reported attempting to get in contact with outpatient providers, but has been unable to establish yet.  He denied current suicidal or homicidal ideations.  He denied any auditory or visual hallucinations.  Patient did not appear to be responding to internal stimuli on exam.  Patient was calm and cooperative with this provider throughout exam.  Patient is willing to sign voluntary consent for inpatient psychiatric admission for further monitoring and stabilization.    Diagnoses:  Active Hospital problems: Active Problems:   Generalized anxiety disorder    Plan   ## Psychiatric Medication Recommendations:  Defer to inpatient unit  ## Medical Decision Making Capacity: Not specifically addressed in this encounter  ## Further Work-up:   -- most recent EKG on 01/07/2024 had QtC of 403 -- Pertinent labwork reviewed earlier this admission includes: CMP, CBC, ethanol, urine drug screen pending   ## Disposition:--Patient willing to sign voluntary consent for inpatient admission  ## Behavioral /  Environmental: - No specific recommendations at this time.     ## Safety and Observation Level:  - Based on my clinical evaluation, I estimate the patient to be at low risk of self harm in the current setting. - At this time, we recommend  routine. This decision is based on my review of the chart including patient's history and current presentation, interview of the patient, mental status examination, and consideration of suicide risk including evaluating suicidal ideation, plan, intent, suicidal or self-harm behaviors, risk factors, and protective  factors. This judgment is based on our ability to directly address suicide risk, implement suicide prevention strategies, and develop a safety plan while the patient is in the clinical setting. Please contact our team if there is a concern that risk level has changed.  CSSR Risk Category:C-SSRS RISK CATEGORY: No Risk  Suicide Risk Assessment: Patient has following modifiable risk factors for suicide: current symptoms: anxiety/panic, insomnia, impulsivity, anhedonia, hopelessness and triggering events, which we are addressing by patient willing to sign voluntary consent for inpatient psychiatric admission for further monitoring and stabilization. Patient has following non-modifiable or demographic risk factors for suicide: male gender Patient has the following protective factors against suicide: Supportive family, no history of suicide attempts, and no history of NSSIB  Thank you for this consult request. Recommendations have been communicated to the primary team.  We will sign off at this time.   Zelda Sharps, NP    History of Present Illness  Relevant Aspects of Hospital ED   Patient Report:  On assessment today, patient more alert when this provider reassessed patient.  Patient able to stay awake for duration of assessment.  Patient with appropriate eye contact and alert and oriented x 4.  Patient reported increased anxiety, insomnia and racing thoughts.  He reported this had been ongoing for quite some time and has built up to the point where he can no longer handle it.  Patient reported symptoms have been present for at least 6 months.  He reported he will lay down at night and attempt to get rest but be unable to stop his worrying thoughts.  He reported if he has a problem that he knows that can be solved the next day, he will still worry about it constantly that night.  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  He reported feeling overwhelmed.  He reported coming to the hospital for help with these feelings and is interested in inpatient treatment.  He reported attempting to get in contact with outpatient providers, but has been unable to establish yet.  He denied current suicidal or homicidal ideations.  He denied any auditory or visual hallucinations.  Patient did not appear to be responding to internal stimuli on exam.  Patient was calm and cooperative with this provider throughout exam.  Patient is willing to sign voluntary consent for inpatient psychiatric admission for further monitoring and stabilization.  Patient reported that he recently separated from his spouse about 1 year ago.  He reported this has been a big adjustment to himself as well.  He denied ever speaking with a therapist or psychiatrist before today.  On current exam, patient reported that he did not feel like he suffered from panic attacks, but did experience feelings of uneasiness and dizziness associated when he gets worried about things.  Patient reported he also feels as though he cannot control the worrying.  Patient  did report feelings of sadness related to not being able to control the anxiety.  He denied any previous history of trauma or abuse.  He denied any nightmares or flashbacks.  He did report he was getting minimal sleep reporting only 2 to 3 hours at night when he is able to go to sleep.  Per chart review, patient was discharged with 5-day supply of Librium when discharged from hospital for alcohol withdrawal on 01/04/2024.  Patient reports since then has taken no medications.  Patient denied any alcohol or illicit substance use since his recent discharge from hospital.  Psych ROS:  Depression: Endorsed feelings of sadness Anxiety: Endorsed Mania (lifetime and current): Denied Psychosis: (lifetime and current): Denied      Psychiatric and Social History  Psychiatric History:  Information collected  from patient  Prev Dx/Sx: Denied Current Psych Provider: None Home Meds (current): Previous Librium taper Previous Med Trials: Librium Therapy: Denied  Prior Psych Hospitalization: Denied Prior Self Harm: Denied Prior Violence: Denied  Family Psych History: Denied Family Hx suicide: Denied  Social History:    Occupational Hx: Ship Broker Hx: Denied Living Situation: Alone Spiritual Hx: Unsure Access to weapons/lethal means: Denied  Substance History Alcohol: Denied current use History of alcohol withdrawal seizures denied History of DT's yes Tobacco: Denied Illicit drugs: Denied Prescription drug abuse: Denied Rehab hx: Denied  Exam Findings  Physical Exam: Reviewed and agree with the physical exam findings conducted by the medical provider Vital Signs:  Temp:  [98 F (36.7 C)-98.5 F (36.9 C)] 98.5 F (36.9 C) (11/20 0930) Pulse Rate:  [69-97] 84 (11/20 1430) Resp:  [15-19] 17 (11/20 0930) BP: (102-137)/(52-91) 122/65 (11/20 1430) SpO2:  [98 %-100 %] 100 % (11/20 1430) Weight:  [71 kg] 71 kg (11/19 1829) Blood pressure 122/65, pulse 84, temperature 98.5 F (36.9 C), resp. rate 17, height 5' 9 (1.753 m), weight 71 kg, SpO2 100%. Body mass index is 23.11 kg/m.    Mental Status Exam: General Appearance: Casual  Orientation:  Full (Time, Place, and Person)  Memory:  Immediate;   Fair Recent;   Fair Remote;   Fair  Concentration:  Concentration: Fair  Recall:  Fair  Attention  Fair  Eye Contact:  Good  Speech:  Clear and Coherent  Language:  Fair  Volume:  Normal  Mood: Anxious  Affect:  Appropriate and Congruent  Thought Process:  Coherent, Goal Directed, and Linear  Thought Content:  Logical  Suicidal Thoughts:  No  Homicidal Thoughts:  No  Judgement:  Intact  Insight:  Present  Psychomotor Activity:  Normal  Akathisia:  No  Fund of Knowledge:  Good      Assets:  Communication Skills Desire for Improvement Financial  Resources/Insurance Housing  Cognition:  WNL  ADL's:  Intact  AIMS (if indicated):        Other History   These have been pulled in through the EMR, reviewed, and updated if appropriate.  Family History:  The patient's family history is not on file.  Medical History: No past medical history on file.  Surgical History: Past Surgical History:  Procedure Laterality Date   NASAL FRACTURE SURGERY       Medications:   Current Facility-Administered Medications:    folic acid (FOLVITE) tablet 1 mg, 1 mg, Oral, Daily, Cyrena Mylar, MD, 1 mg at 01/08/24 0903   LORazepam (ATIVAN) tablet 1-4 mg, 1-4 mg, Oral, Q1H PRN, 1 mg at 01/08/24 1247 **OR** LORazepam (ATIVAN) injection 1-4 mg, 1-4  mg, Intravenous, Q1H PRN, Cyrena Mylar, MD   multivitamin with minerals tablet 1 tablet, 1 tablet, Oral, Daily, Cyrena Mylar, MD, 1 tablet at 01/08/24 0903   thiamine (VITAMIN B1) tablet 100 mg, 100 mg, Oral, Daily, 100 mg at 01/08/24 9096 **OR** thiamine (VITAMIN B1) injection 100 mg, 100 mg, Intravenous, Daily, Cyrena Mylar, MD  Current Outpatient Medications:    chlordiazePOXIDE (LIBRIUM) 25 MG capsule, Take 1 capsule (25 mg total) by mouth 3 (three) times daily as needed for up to 5 days for anxiety or withdrawal. (Patient not taking: Reported on 01/08/2024), Disp: 15 capsule, Rfl: 0   Multiple Vitamin (MULTIVITAMIN WITH MINERALS) TABS tablet, Take 1 tablet by mouth daily. (Patient not taking: Reported on 01/08/2024), Disp: , Rfl:   Allergies: No Known Allergies  Zelda Sharps, NP This note was created using Scientist, clinical (histocompatibility and immunogenetics). Please excuse any inadvertent transcription errors. Case was discussed with supervising physician Dr. Jadapalle who is agreeable with current plan.

## 2024-01-08 NOTE — ED Notes (Signed)
 Dinner tray given to pt

## 2024-01-08 NOTE — TOC CM/SW Note (Signed)
 TOC consult acknowledged, patient is being admitted to inpatient psych. TOC signing off.

## 2024-01-08 NOTE — ED Notes (Signed)
 Pt dressed out by this RN and Journalist, [employer].   White shirt Dark grey pants Slippers Grey underwear Grey socks  Belongings sent home with pt's spouse

## 2024-01-09 ENCOUNTER — Other Ambulatory Visit: Payer: Self-pay

## 2024-01-09 DIAGNOSIS — F411 Generalized anxiety disorder: Principal | ICD-10-CM

## 2024-01-09 LAB — LIPID PANEL
Cholesterol: 233 mg/dL — ABNORMAL HIGH (ref 0–200)
HDL: 64 mg/dL (ref >40)
LDL Cholesterol: 151 mg/dL — ABNORMAL HIGH (ref 0–99)
Total CHOL/HDL Ratio: 3.7 ratio
Triglycerides: 90 mg/dL (ref <150)
VLDL: 18 mg/dL (ref 0–40)

## 2024-01-09 LAB — TSH: TSH: 0.701 u[IU]/mL (ref 0.350–4.500)

## 2024-01-09 LAB — HEMOGLOBIN A1C
Hgb A1c MFr Bld: 5 % (ref 4.8–5.6)
Mean Plasma Glucose: 96.8 mg/dL

## 2024-01-09 MED ORDER — INFLUENZA VIRUS VACC SPLIT PF (FLUZONE) 0.5 ML IM SUSY
0.5000 mL | PREFILLED_SYRINGE | INTRAMUSCULAR | Status: AC
  Administered 2024-01-10: 0.5 mL via INTRAMUSCULAR
  Filled 2024-01-09 (×2): qty 0.5

## 2024-01-09 MED ORDER — FLUOXETINE HCL 20 MG PO CAPS
20.0000 mg | ORAL_CAPSULE | Freq: Every day | ORAL | Status: DC
  Administered 2024-01-09 – 2024-01-11 (×3): 20 mg via ORAL
  Filled 2024-01-09 (×3): qty 1

## 2024-01-09 NOTE — BH IP Treatment Plan (Signed)
 Interdisciplinary Treatment and Diagnostic Plan Update  01/09/2024 Time of Session: 13:40 Anthony Frank MRN: [id number]  Principal Diagnosis: GAD (generalized anxiety disorder)  Secondary Diagnoses: Principal Problem:   GAD (generalized anxiety disorder)   Current Medications:  Current Facility-Administered Medications  Medication Dose Route Frequency Provider Last Rate Last Admin   acetaminophen  (TYLENOL ) tablet 650 mg  650 mg Oral Q6H PRN Smith, Annie B, NP   650 mg at 01/09/24 0858   alum & mag hydroxide-simeth (MAALOX/MYLANTA) 200-200-20 MG/5ML suspension 30 mL  30 mL Oral Q4H PRN Smith, Annie B, NP       chlordiazePOXIDE  (LIBRIUM ) capsule 25 mg  25 mg Oral Q6H PRN Smith, Annie B, NP       haloperidol  (HALDOL ) tablet 5 mg  5 mg Oral TID PRN Smith, Annie B, NP       And   diphenhydrAMINE  (BENADRYL ) capsule 50 mg  50 mg Oral TID PRN Smith, Annie B, NP       haloperidol  lactate (HALDOL ) injection 5 mg  5 mg Intramuscular TID PRN Smith, Annie B, NP       And   diphenhydrAMINE  (BENADRYL ) injection 50 mg  50 mg Intramuscular TID PRN Smith, Annie B, NP       And   LORazepam  (ATIVAN ) injection 2 mg  2 mg Intramuscular TID PRN Smith, Annie B, NP       haloperidol  lactate (HALDOL ) injection 10 mg  10 mg Intramuscular TID PRN Smith, Annie B, NP       And   diphenhydrAMINE  (BENADRYL ) injection 50 mg  50 mg Intramuscular TID PRN Smith, Annie B, NP       And   LORazepam  (ATIVAN ) injection 2 mg  2 mg Intramuscular TID PRN Smith, Annie B, NP       FLUoxetine  (PROZAC ) capsule 20 mg  20 mg Oral Daily Millington, Matthew E, PA-C   20 mg at 01/09/24 1258   folic acid  (FOLVITE ) tablet 1 mg  1 mg Oral Daily Smith, Annie B, NP   1 mg at 01/09/24 9143   hydrOXYzine  (ATARAX ) tablet 25 mg  25 mg Oral TID PRN Smith, Annie B, NP   25 mg at 01/08/24 2338   [START ON 01/10/2024] influenza vac split trivalent PF (FLUZONE ) injection 0.5 mL  0.5 mL Intramuscular Tomorrow-1000 Donnelly Mellow, MD        loperamide  (IMODIUM ) capsule 2-4 mg  2-4 mg Oral PRN Smith, Annie B, NP       magnesium  hydroxide (MILK OF MAGNESIA) suspension 30 mL  30 mL Oral Daily PRN Smith, Annie B, NP       multivitamin with minerals tablet 1 tablet  1 tablet Oral Daily Smith, Annie B, NP   1 tablet at 01/09/24 0856   ondansetron  (ZOFRAN -ODT) disintegrating tablet 4 mg  4 mg Oral Q6H PRN Smith, Annie B, NP       thiamine  (VITAMIN B1) tablet 100 mg  100 mg Oral Daily Smith, Annie B, NP   100 mg at 01/09/24 9143   Or   thiamine  (VITAMIN B1) injection 100 mg  100 mg Intravenous Daily Smith, Annie B, NP       traZODone  (DESYREL ) tablet 50 mg  50 mg Oral QHS PRN Smith, Annie B, NP       PTA Medications: Medications Prior to Admission  Medication Sig Dispense Refill Last Dose/Taking   chlordiazePOXIDE  (LIBRIUM ) 25 MG capsule Take 1 capsule (25 mg total) by mouth 3 (three) times daily  as needed for up to 5 days for anxiety or withdrawal. (Patient not taking: Reported on 01/08/2024) 15 capsule 0    Multiple Vitamin (MULTIVITAMIN WITH MINERALS) TABS tablet Take 1 tablet by mouth daily. (Patient not taking: Reported on 01/08/2024)       Patient Stressors:    Patient Strengths:    Treatment Modalities: Medication Management, Group therapy, Case management,  1 to 1 session with clinician, Psychoeducation, Recreational therapy.   Physician Treatment Plan for Primary Diagnosis: GAD (generalized anxiety disorder) Long Term Goal(s):     Short Term Goals:    Medication Management: Evaluate patient's response, side effects, and tolerance of medication regimen.  Therapeutic Interventions: 1 to 1 sessions, Unit Group sessions and Medication administration.  Evaluation of Outcomes: Not Met  Physician Treatment Plan for Secondary Diagnosis: Principal Problem:   GAD (generalized anxiety disorder)  Long Term Goal(s):     Short Term Goals:       Medication Management: Evaluate patient's response, side effects, and tolerance  of medication regimen.  Therapeutic Interventions: 1 to 1 sessions, Unit Group sessions and Medication administration.  Evaluation of Outcomes: Not Met   RN Treatment Plan for Primary Diagnosis: GAD (generalized anxiety disorder) Long Term Goal(s): Knowledge of disease and therapeutic regimen to maintain health will improve  Short Term Goals: Ability to remain free from injury will improve, Ability to verbalize frustration and anger appropriately will improve, Ability to demonstrate self-control, Ability to participate in decision making will improve, Ability to verbalize feelings will improve, Ability to disclose and discuss suicidal ideas, Ability to identify and develop effective coping behaviors will improve, and Compliance with prescribed medications will improve  Medication Management: RN will administer medications as ordered by provider, will assess and evaluate patient's response and provide education to patient for prescribed medication. RN will report any adverse and/or side effects to prescribing provider.  Therapeutic Interventions: 1 on 1 counseling sessions, Psychoeducation, Medication administration, Evaluate responses to treatment, Monitor vital signs and CBGs as ordered, Perform/monitor CIWA, COWS, AIMS and Fall Risk screenings as ordered, Perform wound care treatments as ordered.  Evaluation of Outcomes: Not Met   LCSW Treatment Plan for Primary Diagnosis: GAD (generalized anxiety disorder) Long Term Goal(s): Safe transition to appropriate next level of care at discharge, Engage patient in therapeutic group addressing interpersonal concerns.  Short Term Goals: Engage patient in aftercare planning with referrals and resources, Increase social support, Increase ability to appropriately verbalize feelings, Increase emotional regulation, Facilitate acceptance of mental health diagnosis and concerns, Facilitate patient progression through stages of change regarding substance use  diagnoses and concerns, Identify triggers associated with mental health/substance abuse issues, and Increase skills for wellness and recovery  Therapeutic Interventions: Assess for all discharge needs, 1 to 1 time with Social worker, Explore available resources and support systems, Assess for adequacy in community support network, Educate family and significant other(s) on suicide prevention, Complete Psychosocial Assessment, Interpersonal group therapy.  Evaluation of Outcomes: Not Met   Progress in Treatment: Attending groups: No. Participating in groups: No. Taking medication as prescribed: Yes. Toleration medication: Yes. Family/Significant other contact made: No, will contact:  when given permission.  Patient understands diagnosis: Yes. Discussing patient identified problems/goals with staff: Yes. Medical problems stabilized or resolved: Yes. Denies suicidal/homicidal ideation: Yes. Issues/concerns per patient self-inventory: No. Other: none  New problem(s) identified: No, Describe:  none identified.  New Short Term/Long Term Goal(s): medication management for mood stabilization; elimination of SI thoughts; development of comprehensive mental wellness/sobriety plan.  Patient  Goals: Trying to get a better life without stress.    Discharge Plan or Barriers: CSW will assist pt with development of an appropriate aftercare/discharge plan.   Reason for Continuation of Hospitalization: Anxiety Medication stabilization  Estimated Length of Stay: 1-7 days  Last 3 [place] Suicide Severity Risk Score: Flowsheet Row Admission (Current) from 01/08/2024 in [facility] INPATIENT BEHAVIORAL MEDICINE Most recent reading at 01/09/2024 12:00 AM ED from 01/08/2024 in [facility] Emergency Department at [facility] Most recent reading at 01/07/2024  6:31 PM UC from 01/21/2023 in [facility] at [facility]  Most recent reading at 01/21/2023  1:46 PM  C-SSRS RISK CATEGORY No Risk No Risk  No Risk    Last PHQ 2/9 Scores:     No data to display          Scribe for Treatment Team: Nadara JONELLE Fam, LCSW 01/09/2024 4:02 PM

## 2024-01-09 NOTE — Progress Notes (Signed)
 Patient was admitted from the ED. Patient was very pleasant but very emotional and anxious. Patient seem to have a whole lot on his plate. Patient has multi stressors. Patient reported passive SI and came to the ED and admitted. Patient has hx. Of alcoholism but denied having heavy drinks at this time. He stated he is now a social drinker.  No AVH observed.  Patient is voluntary. Patient went through a divorce this year and seem to still be getting over it. Patient has support from his daughter who is a consulting civil engineer at [employer] and his two step son. Patient required atarax  overnight for anxiety and tylenol  for headache this morning.

## 2024-01-09 NOTE — Plan of Care (Signed)
  Problem: Education: Goal: Emotional status will improve Outcome: Progressing   Problem: Education: Goal: Mental status will improve Outcome: Progressing   Problem: Education: Goal: Verbalization of understanding the information provided will improve Outcome: Progressing   Problem: Activity: Goal: Interest or engagement in activities will improve Outcome: Progressing   Problem: Coping: Goal: Ability to demonstrate self-control will improve Outcome: Progressing   Problem: Coping: Goal: Ability to verbalize frustrations and anger appropriately will improve Outcome: Progressing   Problem: Health Behavior/Discharge Planning: Goal: Compliance with treatment plan for underlying cause of condition will improve Outcome: Progressing   Problem: Physical Regulation: Goal: Ability to maintain clinical measurements within normal limits will improve Outcome: Progressing   Problem: Safety: Goal: Periods of time without injury will increase Outcome: Progressing

## 2024-01-09 NOTE — H&P (Signed)
 Psychiatric Admission Assessment Adult  Patient Identification: Anthony Frank MRN:  [id number] Date of Evaluation:  01/09/2024 Chief Complaint:  GAD (generalized anxiety disorder) [F41.1]   History of Present Illness: Patient seen for initial visit.  They are alert and oriented.  They are linear logical and future oriented.  They report they are here due to high anxiety and that they had tried to establish with outpatient provider but they were unable to.  They report they were actually able to get an appointment scheduled for today but then agreed to the admission.  There were poor alcohol use to manage anxiety but indicate they have not had any alcohol since last week.  Patient reports anxiety symptoms have been present for quite some time but have been worsening over the past 6 months.  He endorses excessive worry racing thoughts and a general sense of anxiety at baseline.  He endorses stressors of financial, relationship and work.  Indicates he is separated from his wife and they have a children that he still financially supports both of them.  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  He is linear logical and future oriented.  He denied a history of panic attacks however he does note periods of an uneasiness and dizziness associated with excessive worry.  He denies depression.  Denies history of trauma or abuse.  He reports 4 to 5 hours of sleep nightly secondary to racing thoughts.  Denies history consistent with mania or psychosis.  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  He may be minimizing alcohol use but denies all symptoms of withdrawal on exam today.  Most recent CIWA was 4.  Discussed trial of Prozac  and hydroxyzine  given ongoing anxiety reviewed risk first benefits and medication information patient is agreeable with this plan.  He voices no concerns or complaints.  He demonstrates good insight to the need for outpatient  follow-up.  He denies SI, HI, and AVH.  Total Time spent with patient: 1 hour Sleep  Sleep:Sleep: Poor  Past Psychiatric History: Psychiatric History:  Information collected from patient   Prev Dx/Sx: Denied Current Psych Provider: None Home Meds (current): Previous Librium  taper Previous Med Trials: Librium  Therapy: Denied   Prior Psych Hospitalization: Denied Prior Self Harm: Denied Prior Violence: Denied   Family Psych History: Denied Family Hx suicide: Denied   Social History:     Occupational Hx: Ship Broker Hx: Denied Living Situation: Alone Spiritual Hx: Unsure Access to weapons/lethal means: Denied   Substance History Alcohol: Denied current use History of alcohol withdrawal seizures denied History of DT's yes Tobacco: Denied Illicit drugs: Denied Prescription drug abuse: Denied Rehab hx: Denied  Columbia Scale:  Flowsheet Row Admission (Current) from 01/08/2024 in [facility] INPATIENT BEHAVIORAL MEDICINE Most recent reading at 01/09/2024 12:00 AM ED from 01/08/2024 in [facility] Emergency Department at [facility] Most recent reading at 01/07/2024  6:31 PM UC from 01/21/2023 in [facility] Care at [facility]  Most recent reading at 01/21/2023  1:46 PM  C-SSRS RISK CATEGORY No Risk No Risk No Risk     Past Medical History: History reviewed. No pertinent past medical history.  Past Surgical History:  Procedure Laterality Date   NASAL FRACTURE SURGERY     Family History: History reviewed. No pertinent family history.  Social History:  Social History   Substance and Sexual Activity  Alcohol Use Yes   Comment: Pt drinks beer and liquor  Social History   Substance and Sexual Activity  Drug Use No      Allergies:  No Known Allergies Lab Results:  Results for orders placed or performed during the hospital encounter of 01/08/24 (from the past 48 hours)  TSH     Status: None   Collection Time: 01/09/24  6:16 AM  Result Value Ref  Range   TSH 0.701 0.350 - 4.500 uIU/mL    Comment: Performed at [facility], [address] [place], [place] [address]  Hemoglobin A1c     Status: None   Collection Time: 01/09/24  6:16 AM  Result Value Ref Range   Hgb A1c MFr Bld 5.0 4.8 - 5.6 %    Comment: (NOTE) Diagnosis of Diabetes The following HbA1c ranges recommended by the American Diabetes Association (ADA) may be used as an aid in the diagnosis of diabetes mellitus.  Hemoglobin             Suggested A1C NGSP%              Diagnosis  <5.7                   Non Diabetic  5.7-6.4                Pre-Diabetic  >6.4                   Diabetic  <7.0                   Glycemic control for                       adults with diabetes.     Mean Plasma Glucose 96.8 mg/dL    Comment: Performed at [facility], [address] [place], [place] [address]  Lipid panel     Status: Abnormal   Collection Time: 01/09/24  6:16 AM  Result Value Ref Range   Cholesterol 233 (H) 0 - 200 mg/dL    Comment:        ATP III CLASSIFICATION:  <200     mg/dL   Desirable  799-760  mg/dL   Borderline High  >=759    mg/dL   High           Triglycerides 90 <150 mg/dL   HDL 64 >59 mg/dL   Total CHOL/HDL Ratio 3.7 RATIO   VLDL 18 0 - 40 mg/dL   LDL Cholesterol 848 (H) 0 - 99 mg/dL    Comment:        Total Cholesterol/HDL:CHD Risk Coronary Heart Disease Risk Table                     Men   Women  1/2 Average Risk   3.4   3.3  Average Risk       5.0   4.4  2 X Average Risk   9.6   7.1  3 X Average Risk  23.4   11.0        Use the calculated Patient Ratio above and the CHD Risk Table to determine the patient's CHD Risk.        ATP III CLASSIFICATION (LDL):  <100     mg/dL   Optimal  899-870  mg/dL   Near or Above                    Optimal  130-159  mg/dL  Borderline  160-189  mg/dL   High  >809     mg/dL   Very High Performed at [facility], [address] [place], [place] [address]     Blood  Alcohol level:  Lab Results  Component Value Date   [facility] <15 01/07/2024   ETH <15 12/31/2023    Metabolic Disorder Labs:  Lab Results  Component Value Date   HGBA1C 5.0 01/09/2024   MPG 96.8 01/09/2024   No results found for: PROLACTIN Lab Results  Component Value Date   CHOL 233 (H) 01/09/2024   TRIG 90 01/09/2024   HDL 64 01/09/2024   CHOLHDL 3.7 01/09/2024   VLDL 18 01/09/2024   LDLCALC 151 (H) 01/09/2024    Current Medications: Current Facility-Administered Medications  Medication Dose Route Frequency Provider Last Rate Last Admin   acetaminophen  (TYLENOL ) tablet 650 mg  650 mg Oral Q6H PRN Smith, Annie B, NP   650 mg at 01/09/24 0858   alum & mag hydroxide-simeth (MAALOX/MYLANTA) 200-200-20 MG/5ML suspension 30 mL  30 mL Oral Q4H PRN Smith, Annie B, NP       chlordiazePOXIDE  (LIBRIUM ) capsule 25 mg  25 mg Oral Q6H PRN Smith, Annie B, NP       haloperidol  (HALDOL ) tablet 5 mg  5 mg Oral TID PRN Smith, Annie B, NP       And   diphenhydrAMINE  (BENADRYL ) capsule 50 mg  50 mg Oral TID PRN Smith, Annie B, NP       haloperidol  lactate (HALDOL ) injection 5 mg  5 mg Intramuscular TID PRN Smith, Annie B, NP       And   diphenhydrAMINE  (BENADRYL ) injection 50 mg  50 mg Intramuscular TID PRN Smith, Annie B, NP       And   LORazepam  (ATIVAN ) injection 2 mg  2 mg Intramuscular TID PRN Smith, Annie B, NP       haloperidol  lactate (HALDOL ) injection 10 mg  10 mg Intramuscular TID PRN Smith, Annie B, NP       And   diphenhydrAMINE  (BENADRYL ) injection 50 mg  50 mg Intramuscular TID PRN Smith, Annie B, NP       And   LORazepam  (ATIVAN ) injection 2 mg  2 mg Intramuscular TID PRN Smith, Annie B, NP       FLUoxetine  (PROZAC ) capsule 20 mg  20 mg Oral Daily Shaneece Stockburger E, PA-C   20 mg at 01/09/24 1258   folic acid  (FOLVITE ) tablet 1 mg  1 mg Oral Daily Smith, Annie B, NP   1 mg at 01/09/24 9143   hydrOXYzine  (ATARAX ) tablet 25 mg  25 mg Oral TID PRN Smith, Annie B, NP   25 mg at  01/08/24 2338   [START ON 01/10/2024] influenza vac split trivalent PF (FLUZONE ) injection 0.5 mL  0.5 mL Intramuscular Tomorrow-1000 Donnelly Mellow, MD       loperamide  (IMODIUM ) capsule 2-4 mg  2-4 mg Oral PRN Claudene Sham B, NP       magnesium  hydroxide (MILK OF MAGNESIA) suspension 30 mL  30 mL Oral Daily PRN Smith, Annie B, NP       multivitamin with minerals tablet 1 tablet  1 tablet Oral Daily Smith, Annie B, NP   1 tablet at 01/09/24 0856   ondansetron  (ZOFRAN -ODT) disintegrating tablet 4 mg  4 mg Oral Q6H PRN Smith, Annie B, NP       thiamine  (VITAMIN B1) tablet 100 mg  100 mg Oral Daily Claudene,  Annie B, NP   100 mg at 01/09/24 9143   Or   thiamine  (VITAMIN B1) injection 100 mg  100 mg Intravenous Daily Smith, Annie B, NP       traZODone  (DESYREL ) tablet 50 mg  50 mg Oral QHS PRN Smith, Annie B, NP       PTA Medications: Medications Prior to Admission  Medication Sig Dispense Refill Last Dose/Taking   chlordiazePOXIDE  (LIBRIUM ) 25 MG capsule Take 1 capsule (25 mg total) by mouth 3 (three) times daily as needed for up to 5 days for anxiety or withdrawal. (Patient not taking: Reported on 01/08/2024) 15 capsule 0    Multiple Vitamin (MULTIVITAMIN WITH MINERALS) TABS tablet Take 1 tablet by mouth daily. (Patient not taking: Reported on 01/08/2024)       Psychiatric Specialty Exam:  Presentation  General Appearance: Casual  Eye Contact:Fair  Speech:Clear and Coherent  Speech Volume:Normal    Mood and Affect  Mood:Anxious  Affect:Congruent   Thought Process  Thought Processes:Coherent  Descriptions of Associations:Intact  Orientation:Full (Time, Place and Person)  Thought Content:Logical; WDL  Hallucinations:Hallucinations: None  Ideas of Reference:None  Suicidal Thoughts:Suicidal Thoughts: No  Homicidal Thoughts:Homicidal Thoughts: No   Sensorium  Memory:Immediate Fair; Recent Fair  Judgment:Good  Insight:Fair   Executive Functions   Concentration:Fair  Attention Span:Fair  Recall:Fair  Fund of Knowledge:Fair  Language:Fair   Psychomotor Activity  Psychomotor Activity:Psychomotor Activity: Normal   Assets  Assets:Communication Skills; Desire for Improvement; Housing; Physical Health; Social Support; Vocational/Educational    Musculoskeletal: Strength & Muscle Tone: within normal limits Gait & Station: normal  Physical Exam: Physical Exam Vitals and nursing note reviewed.  HENT:     Head: Atraumatic.  Eyes:     Extraocular Movements: Extraocular movements intact.  Pulmonary:     Effort: Pulmonary effort is normal.  Neurological:     Mental Status: He is alert and oriented to person, place, and time.  Psychiatric:        Behavior: Behavior normal.    Review of Systems  Psychiatric/Behavioral:  Positive for substance abuse. Negative for depression, hallucinations and suicidal ideas. The patient is nervous/anxious and has insomnia.    Blood pressure 128/81, pulse 69, temperature 98.1 F (36.7 C), temperature source Oral, resp. rate 18, height 5' 9 (1.753 m), weight 71 kg, SpO2 100%. Body mass index is 23.11 kg/m.  Principal Diagnosis: GAD (generalized anxiety disorder) Diagnosis:  Principal Problem:   GAD (generalized anxiety disorder)   Clinical Decision Making:  Treatment Plan Summary:  Safety and Monitoring:             -- Voluntary admission to inpatient psychiatric unit for safety, stabilization and treatment             -- Daily contact with patient to assess and evaluate symptoms and progress in treatment             -- Patient's case to be discussed in multi-disciplinary team meeting             -- Observation Level: q15 minute checks             -- Vital signs:  q12 hours             -- Precautions: suicide, elopement, and assault   2. Psychiatric Diagnoses and Treatment:               Start Prozac  20 mg daily for anxiety Continue hydroxyzine  25 mg as needed for anxiety  and sleep   --  The risks/benefits/side-effects/alternatives to this medication were discussed in detail with the patient and time was given for questions. The patient consents to medication trial.                -- Metabolic profile and EKG monitoring obtained while on an atypical antipsychotic (BMI: Lipid Panel: HbgA1c: QTc:)              -- Encouraged patient to participate in unit milieu and in scheduled group therapies                            3. Medical Issues Being Addressed:    No acute concerns 4. Discharge Planning:              -- Social work and case management to assist with discharge planning and identification of hospital follow-up needs prior to discharge             -- Estimated LOS: 5-7 days             -- Discharge Concerns: Need to establish a safety plan; Medication compliance and effectiveness             -- Discharge Goals: Return home with outpatient referrals follow ups  Physician Treatment Plan for Primary Diagnosis: GAD (generalized anxiety disorder) Long Term Goal(s): Improvement in symptoms so as ready for discharge  Short Term Goals: Ability to identify changes in lifestyle to reduce recurrence of condition will improve, Ability to maintain clinical measurements within normal limits will improve, Compliance with prescribed medications will improve, and Ability to identify triggers associated with substance abuse/mental health issues will improve  Physician Treatment Plan for Secondary Diagnosis: Principal Problem:   GAD (generalized anxiety disorder)  I have reviewed this case with Dr. Jadapalle who is agreeable with this plan.  I certify that inpatient services furnished can reasonably be expected to improve the patient's condition.    Donnice FORBES Right, PA-C 11/21/20254:44 PM

## 2024-01-09 NOTE — Group Note (Signed)
 Recreation Therapy Group Note   Group Topic:Other  Group Date: 01/09/2024 Start Time: 1530 End Time: 1620 Facilitators: Celestia Jeoffrey BRAVO, LRT, CTRS Location: Courtyard  Group Description: [employer]. LRT and patients played games of basketball, drew with chalk, and played corn hole while outside in the courtyard while getting fresh air and sunlight. Music was being played in the background. LRT and peers conversed about different games they have played before, what they do in their free time and anything else that is on their minds. LRT encouraged pts to drink water after being outside, sweating and getting their heart rate up.  Goal Area(s) Addressed: Patient will build on frustration tolerance skills. Patients will partake in a competitive play game with peers. Patients will gain knowledge of new leisure interest/hobby.    Affect/Mood: Appropriate   Participation Level: Active   Participation Quality: Independent   Behavior: Appropriate   Speech/Thought Process: Coherent   Insight: Fair   Judgement: Fair    Modes of Intervention: Activity   Patient Response to Interventions:  Receptive   Education Outcome:  In group clarification offered    Clinical Observations/Individualized Feedback: Jalynn was active in their participation of session activities and group discussion. Pt interacted well with LRT and peers duration of session.    Plan: Continue to engage patient in RT group sessions 2-3x/week.   Jeoffrey BRAVO Celestia, LRT, CTRS 01/09/2024 5:19 PM

## 2024-01-09 NOTE — BHH Suicide Risk Assessment (Signed)
 [facility] Admission Suicide Risk Assessment   Nursing information obtained from:    Demographic factors:  Divorced or widowed Current Mental Status:  NA (NO SI) Loss Factors:  Loss of significant relationship, NA Historical Factors:  NA Risk Reduction Factors:  Sense of responsibility to family  Total Time spent with patient: 1 hour Principal Problem: GAD (generalized anxiety disorder) Diagnosis:  Principal Problem:   GAD (generalized anxiety disorder)  Subjective Data: see h and p  Continued Clinical Symptoms:    The Alcohol Use Disorders Identification Test, Guidelines for Use in Primary Care, Second Edition.  World Science Writer [facility]). Score between 0-7:  no or low risk or alcohol related problems. Score between 8-15:  moderate risk of alcohol related problems. Score between 16-19:  high risk of alcohol related problems. Score 20 or above:  warrants further diagnostic evaluation for alcohol dependence and treatment.   CLINICAL FACTORS:   Severe Anxiety and/or Agitation   Musculoskeletal: Strength & Muscle Tone: within normal limits Gait & Station: normal Patient leans: N/A  Psychiatric Specialty Exam:  Presentation  General Appearance:  Casual  Eye Contact: Fair  Speech: Clear and Coherent  Speech Volume: Normal  Handedness:No data recorded  Mood and Affect  Mood: Anxious  Affect: Congruent   Thought Process  Thought Processes: Coherent  Descriptions of Associations:Intact  Orientation:Full (Time, Place and Person)  Thought Content:Logical; WDL  History of Schizophrenia/Schizoaffective disorder:No data recorded Duration of Psychotic Symptoms:No data recorded Hallucinations:Hallucinations: None  Ideas of Reference:None  Suicidal Thoughts:Suicidal Thoughts: No  Homicidal Thoughts:Homicidal Thoughts: No   Sensorium  Memory: Immediate Fair; Recent Fair  Judgment: Good  Insight: Fair   Art Therapist   Concentration: Fair  Attention Span: Fair  Recall: [employer] of Knowledge: Fair  Language: Fair   Psychomotor Activity  Psychomotor Activity: Psychomotor Activity: Normal   Assets  Assets: Communication Skills; Desire for Improvement; Housing; Physical Health; Social Support; Vocational/Educational   Sleep  Sleep: Sleep: Poor    Physical Exam: Physical Exam ROS Blood pressure 128/81, pulse 69, temperature 98.1 F (36.7 C), temperature source Oral, resp. rate 18, height 5' 9 (1.753 m), weight 71 kg, SpO2 100%. Body mass index is 23.11 kg/m.   COGNITIVE FEATURES THAT CONTRIBUTE TO RISK:  None    SUICIDE RISK:   Minimal: No identifiable suicidal ideation.  Patients presenting with no risk factors but with morbid ruminations; may be classified as minimal risk based on the severity of the depressive symptoms  PLAN OF CARE: 1.    Safety and Monitoring:   --  Voluntary admission to inpatient psychiatric unit for safety, stabilization and treatment -- Daily contact with patient to assess and evaluate symptoms and progress in treatment -- Patient's case to be discussed in multi-disciplinary team meeting -- Observation Level : q15 minute checks -- Vital signs:  q12 hours -- Precautions: suicide   I certify that inpatient services furnished can reasonably be expected to improve the patient's condition.   Donnice FORBES Right, PA-C 01/09/2024, 4:46 PM

## 2024-01-09 NOTE — Progress Notes (Signed)
   01/09/24 0900  Psych Admission Type (Psych Patients Only)  Admission Status Voluntary  Psychosocial Assessment  Patient Complaints Worrying  Eye Contact Fair  Facial Expression Anxious  Affect Appropriate to circumstance  Speech Logical/coherent  Interaction Assertive  Motor Activity Other (Comment) (q15 min safety checks)  Appearance/Hygiene Unremarkable  Behavior Characteristics Cooperative;Appropriate to situation  Mood Pleasant  Thought Process  Coherency WDL  Content WDL  Delusions None reported or observed  Perception WDL  Hallucination None reported or observed  Judgment WDL  Confusion WDL  Danger to Self  Current suicidal ideation? Denies  Danger to Others  Danger to Others None reported or observed

## 2024-01-09 NOTE — Group Note (Signed)
 Recreation Therapy Group Note   Group Topic:Leisure Education  Group Date: 01/09/2024 Start Time: 1010 End Time: 1115 Facilitators: Celestia Jeoffrey BRAVO, LRT, CTRS Location: Craft Room  Group Description: Leisure. Patients were given the option to choose from journaling, coloring, drawing, making origami, playing with playdoh, listening to music or singing karaoke. LRT and pts discussed the meaning of leisure, the importance of participating in leisure during their free time/when they're outside of the hospital, as well as how our leisure interests can also serve as coping skills.   Goal Area(s) Addressed:  Patient will identify a current leisure interest.  Patient will learn the definition of "leisure". Patient will practice making a positive decision. Patient will have the opportunity to try a new leisure activity. Patient will communicate with peers and LRT.    Affect/Mood: N/A   Participation Level: Did not attend    Clinical Observations/Individualized Feedback: Patient did not attend group.   Plan: Continue to engage patient in RT group sessions 2-3x/week.   Jeoffrey BRAVO Celestia, LRT, CTRS 01/09/2024 11:42 AM

## 2024-01-09 NOTE — Progress Notes (Addendum)
 Pt remains on 1:1 with sitter @ bedside, he continues to have a sad and flat affect, mood depressed. Pt denied having current SI thoughts, plan or intent.      01/09/24 2100  Psych Admission Type (Psych Patients Only)  Admission Status Voluntary  Psychosocial Assessment  Patient Complaints Anxiety  Eye Contact Fair  Facial Expression Anxious  Affect Appropriate to circumstance  Speech Logical/coherent  Interaction Assertive  Motor Activity Tremors  Appearance/Hygiene Unremarkable  Behavior Characteristics Cooperative;Appropriate to situation  Mood Pleasant  Thought Process  Coherency WDL  Content WDL  Delusions None reported or observed  Perception WDL  Hallucination None reported or observed  Judgment WDL  Confusion WDL  Danger to Self  Current suicidal ideation? Denies  Danger to Others  Danger to Others None reported or observed

## 2024-01-09 NOTE — [facility] Counselor (Signed)
 Adult Comprehensive Assessment  Patient ID: Anthony Frank, male   DOB: [birth year], 48 y.o.   MRN: [id number]  Information Source: Information source: Patient  Current Stressors:  Patient states their primary concerns and needs for treatment are:: More for anxiety. Pt reported increased anxiety, insomnia, and racing thoughts. Patient states their goals for this hospitilization and ongoing recovery are:: Treatment to control so that my days go smoothly. Try to control my brain to work smoothly. 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 Family Relationships: Separated from wife for two years. Financial / Lack of resources (include bankruptcy): Paying for daughter's college, all the bills, and requests from ex-wife. Housing / Lack of housing: Pt reported that he is staying with ex-wife but plans to move out in January. Physical health (include injuries & life threatening diseases): None reported Social relationships: None reported Substance abuse: None reported Bereavement / Loss: None reported  Living/Environment/Situation:  Living Arrangements: Spouse/significant other Living conditions (as described by patient or guardian): He reported that he lives in [place]. Who else lives in the home?: He stated that his ex-wife also lives there How long has patient lived in current situation?: Like eight years. Pt explained that he moved out for a year but came back at the request of his daughter and doctor last year when told he shouldn't be alone. He reported plans to move out in January. What is atmosphere in current home: Comfortable, Temporary  Family History:  Marital status: Separated Separated, when?: Over two years. What types of issues is patient dealing with in the relationship?: Requests for more money or things from me. Are you sexually active?:  (Unable to assess) What is your sexual  orientation?: Unable to assess Has your sexual activity been affected by drugs, alcohol, medication, or emotional stress?: Unable to assess Does patient have children?: Yes How many children?: 3 (daughter and two stepsons.) How is patient's relationship with their children?: Pt reported his daughter is in college at [employer]. Great. They love me. He stated that his stepsons are successful.  Childhood History:  By whom was/is the patient raised?: Both parents Additional childhood history information: Hard. Pt stated that his mother was like an iceburg and his father was the same. He reported that his father has other women and other children. Pt shared that his parents divorced with pt was in his 60s. Description of patient's relationship with caregiver when they were a child: He described his relationship with his parents as cold or distant. Patient's description of current relationship with people who raised him/her: Good. I talk with them both normally through text each day. 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 He stated he wouldn't see his mother for months at a time. Does patient have siblings?: Yes Number of Siblings: 2 (Younger sister and one stepsister.) Description of patient's current relationship with siblings: Sister: We talk everyday. Stepsister: A little bit. Did patient suffer any verbal/emotional/physical/sexual abuse as a child?: No Did patient suffer from severe childhood neglect?: No Has patient ever been sexually abused/assaulted/raped as an adolescent or adult?: No Was the patient ever a victim of a crime or a disaster?: No Witnessed domestic violence?: No Has patient been affected by domestic violence as an adult?: No  Education:  Highest grade of school patient has completed: Some college  Currently a student?: No Learning  disability?: No  Employment/Work Situation:   Employment Situation: Employed Where is Patient Currently Employed?: Pt reported he has his own designer, jewellery business and works with [employer]. How Long has Patient Been Employed?: 15 years. Are You Satisfied With Your Job?: Yes Do You Work More Than One Job?: No Work Stressors: I'm the hears of the business. I have to do everything. Patient's Job has Been Impacted by Current Illness: Yes Describe how Patient's Job has Been Impacted: Pt reported that he feels his anxiety impacts his job sometimes. What is the Longest Time Patient has Held a Job?: Current job Where was the Patient Employed at that Time?: Current job. Has Patient ever Been in the [employer]?: No  Financial Resources:   Financial resources: Income from employment Does patient have a representative payee or guardian?: No  Alcohol/Substance Abuse:   What has been your use of drugs/alcohol within the last 12 months?: Pt reported that he does not use drugs. He shared that he drank daily while in [place] on vacation. If attempted suicide, did drugs/alcohol play a role in this?: No Alcohol/Substance Abuse Treatment Hx: Denies past history If yes, describe treatment: N/A Has alcohol/substance abuse ever caused legal problems?: No  Social Support System:   Patient's Community Support System: None Describe Community Support System: I don't have one. Type of faith/religion: I believe in God. How does patient's faith help to cope with current illness?: God is my support.  Leisure/Recreation:   Do You Have Hobbies?: Yes Leisure and Hobbies: Music, totally. Sax player.  Strengths/Needs:   What is the patient's perception of their strengths?: Music totally. Patient states these barriers may affect/interfere with their treatment: Pt denied any barriers Patient states these barriers may affect their return to the community: Pt denied any  barriers. Other important information patient would like considered in planning for their treatment: Pt shared that he would like a therapist to talk to.  Discharge Plan:   Currently receiving community mental health services: Yes (From Whom) (Pt unable to recall the name of provider or office.) Patient states concerns and preferences for aftercare planning are: He reported that he would like a therapist to talk to. Patient states they will know when they are safe and ready for discharge when: I feel good. Maybe I need a little more rest. Does patient have access to transportation?: Yes Does patient have financial barriers related to discharge medications?: No Will patient be returning to same living situation after discharge?: Yes  Summary/Recommendations:   Summary and Recommendations (to be completed by the evaluator): Patient is a 48 year old, married but separated, male from [place], [place] [facility] [place]). He reported that he came into the hospital because of increasing anxiety. Pt also endorsed insomnia and racing thoughts. He reported that his goal for hospitalization is to get treatment to control his anxiety so that his days go smoother. He reported that he lives with his ex-wife currently but endorsed plans to move out on his own in January. However, pt will be returning home upon discharge. Stressors identified as having his own business, requests from his ex-wife, and paying for his daughter's tuition. Pt denied any history of abuse or trauma. He reported that he does not use drugs. Pt does acknowledge use of alcohol but says that he does not drink often. He did acknowledge drinking daily recently while on vacation in [place]. Pt shared that he recently scheduled an appointment for psych services which is for today  at [facility]. Pt made aware that this would need to be rescheduled and nursing staff notified as well. He expressed interest in getting connected with therapy as well.  Recommendations  include: crisis stabilization, therapeutic milieu, encourage group attendance and participation, medication management for mood stabilization and development of a comprehensive mental wellness plan.  Nadara JONELLE Fam. 01/09/2024

## 2024-01-09 NOTE — Plan of Care (Signed)
  Problem: Education: Goal: Knowledge of [facility] Education information/materials will improve Outcome: Progressing Goal: Emotional status will improve Outcome: Progressing Goal: Mental status will improve Outcome: Progressing Goal: Verbalization of understanding the information provided will improve Outcome: Progressing   Problem: Activity: Goal: Interest or engagement in activities will improve Outcome: Progressing Goal: Sleeping patterns will improve Outcome: Progressing   Problem: Coping: Goal: Ability to verbalize frustrations and anger appropriately will improve Outcome: Progressing Goal: Ability to demonstrate self-control will improve Outcome: Progressing   Problem: Safety: Goal: Periods of time without injury will increase Outcome: Progressing

## 2024-01-09 NOTE — Group Note (Signed)
 Date:  01/09/2024 Time:  8:39 PM  Group Topic/Focus:  Wrap-Up Group:   The focus of this group is to help patients review their daily goal of treatment and discuss progress on daily workbooks.    Participation Level:  Did Not Attend  Participation Quality:  none  Affect:  none  Cognitive:  none  Insight: None  Engagement in Group:  none  Modes of Intervention:  none  Additional Comments:  none   Kerri Katz 01/09/2024, 8:39 PM

## 2024-01-09 NOTE — Plan of Care (Signed)
 Anthony Frank is a 48 y.o. male patient. No diagnosis found. History reviewed. No pertinent past medical history. Current Facility-Administered Medications  Medication Dose Route Frequency Provider Last Rate Last Admin   acetaminophen  (TYLENOL ) tablet 650 mg  650 mg Oral Q6H PRN Smith, Annie B, NP   650 mg at 01/09/24 0154   alum & mag hydroxide-simeth (MAALOX/MYLANTA) 200-200-20 MG/5ML suspension 30 mL  30 mL Oral Q4H PRN Smith, Annie B, NP       chlordiazePOXIDE  (LIBRIUM ) capsule 25 mg  25 mg Oral Q6H PRN Smith, Annie B, NP       haloperidol  (HALDOL ) tablet 5 mg  5 mg Oral TID PRN Smith, Annie B, NP       And   diphenhydrAMINE  (BENADRYL ) capsule 50 mg  50 mg Oral TID PRN Smith, Annie B, NP       haloperidol  lactate (HALDOL ) injection 5 mg  5 mg Intramuscular TID PRN Smith, Annie B, NP       And   diphenhydrAMINE  (BENADRYL ) injection 50 mg  50 mg Intramuscular TID PRN Smith, Annie B, NP       And   LORazepam  (ATIVAN ) injection 2 mg  2 mg Intramuscular TID PRN Smith, Annie B, NP       haloperidol  lactate (HALDOL ) injection 10 mg  10 mg Intramuscular TID PRN Smith, Annie B, NP       And   diphenhydrAMINE  (BENADRYL ) injection 50 mg  50 mg Intramuscular TID PRN Smith, Annie B, NP       And   LORazepam  (ATIVAN ) injection 2 mg  2 mg Intramuscular TID PRN Smith, Annie B, NP       folic acid  (FOLVITE ) tablet 1 mg  1 mg Oral Daily Smith, Annie B, NP       hydrOXYzine  (ATARAX ) tablet 25 mg  25 mg Oral TID PRN Smith, Annie B, NP   25 mg at 01/08/24 2338   [START ON 01/10/2024] influenza vac split trivalent PF (FLUZONE ) injection 0.5 mL  0.5 mL Intramuscular Tomorrow-1000 Donnelly Mellow, MD       loperamide  (IMODIUM ) capsule 2-4 mg  2-4 mg Oral PRN Claudene Zelda NOVAK, NP       magnesium  hydroxide (MILK OF MAGNESIA) suspension 30 mL  30 mL Oral Daily PRN Smith, Annie B, NP       multivitamin with minerals tablet 1 tablet  1 tablet Oral Daily Smith, Annie B, NP       ondansetron  (ZOFRAN -ODT)  disintegrating tablet 4 mg  4 mg Oral Q6H PRN Smith, Annie B, NP       thiamine  (VITAMIN B1) tablet 100 mg  100 mg Oral Daily Smith, Annie B, NP       Or   thiamine  (VITAMIN B1) injection 100 mg  100 mg Intravenous Daily Smith, Annie B, NP       traZODone  (DESYREL ) tablet 50 mg  50 mg Oral QHS PRN Smith, Annie B, NP       No Known Allergies Principal Problem:   GAD (generalized anxiety disorder)  Blood pressure 133/76, pulse 76, temperature 97.6 F (36.4 C), temperature source Oral, resp. rate 18, height 5' 9 (1.753 m), weight 71 kg, SpO2 99%.    Anthony Frank B Anthony Frank 01/09/2024

## 2024-01-09 NOTE — Group Note (Signed)
 Date:  01/09/2024 Time:  10:37 AM  Group Topic/Focus:  Dimensions of Wellness:   The focus of this group is to introduce the topic of wellness and discuss the role each dimension of wellness plays in total health.    Participation Level:  Did Not Attend   Anthony Frank 01/09/2024, 10:37 AM

## 2024-01-10 MED ORDER — HYDROXYZINE HCL 25 MG PO TABS: 25.0000 mg | ORAL_TABLET | Freq: Two times a day (BID) | ORAL | Status: DC | PRN

## 2024-01-10 MED ORDER — HYDROXYZINE HCL 50 MG PO TABS
50.0000 mg | ORAL_TABLET | Freq: Every day | ORAL | Status: DC
  Administered 2024-01-10: 50 mg via ORAL
  Filled 2024-01-10: qty 1

## 2024-01-10 NOTE — Group Note (Signed)
 [facility] LCSW Group Therapy Note   Group Date: 01/10/2024 Start Time: 1330 End Time: 1430   Type of Therapy/Topic:  Group Therapy:  Emotion Regulation  Participation Level:  Did Not Attend   Mood:  Description of Group:    The purpose of this group is to assist patients in learning to regulate negative emotions and experience positive emotions. Patients will be guided to discuss ways in which they have been vulnerable to their negative emotions. These vulnerabilities will be juxtaposed with experiences of positive emotions or situations, and patients challenged to use positive emotions to combat negative ones. Special emphasis will be placed on coping with negative emotions in conflict situations, and patients will process healthy conflict resolution skills.  Therapeutic Goals: Patient will identify two positive emotions or experiences to reflect on in order to balance out negative emotions:  Patient will label two or more emotions that they find the most difficult to experience:  Patient will be able to demonstrate positive conflict resolution skills through discussion or role plays:   Summary of Patient Progress:   Patient did not attend group    Therapeutic Modalities:   Cognitive Behavioral Therapy Feelings Identification Dialectical Behavioral Therapy   Aldo CHRISTELLA Niece, LCSW

## 2024-01-10 NOTE — Progress Notes (Signed)
   01/09/24 2100  Psych Admission Type (Psych Patients Only)  Admission Status Voluntary  Psychosocial Assessment  Patient Complaints Anxiety  Eye Contact Fair  Facial Expression Anxious  Affect Appropriate to circumstance  Speech Logical/coherent  Interaction Assertive  Motor Activity Tremors  Appearance/Hygiene Unremarkable  Behavior Characteristics Cooperative;Appropriate to situation  Mood Pleasant  Thought Process  Coherency WDL  Content WDL  Delusions None reported or observed  Perception WDL  Hallucination None reported or observed  Judgment WDL  Confusion WDL  Danger to Self  Current suicidal ideation? Denies  Danger to Others  Danger to Others None reported or observed

## 2024-01-10 NOTE — Progress Notes (Signed)
 [facility] MD Progress Note  01/10/2024 3:04 PM Anthony Frank  MRN:  [id number]   Subjective:  Chart reviewed, case discussed in multidisciplinary meeting, patient seen during rounds.   Patient seen for follow-up today they are alert and oriented.  They are linear logical.  They have been medication compliant.  They are future oriented.  They deny SI HI and AVH.  Deny adverse effects of medications.  Did not require behavioral PRNs overnight.  They note improved but still difficult sleep.  They report anxiety is most significant at bedtime.  Will will utilize Atarax  at bedtime.  They found that the Atarax  during the day was beneficial yesterday.  They demonstrate good insight into the need for outpatient follow-up and continued medication compliance.  They voiced no concerns or complaints.  They are able to identify crisis resources, social support and continue to work to develop coping skills.  They are here voluntarily and do not meet IVC criteria.  They request discharge for tomorrow.  There are no safety concerns patient has never reported SI HI or acute psychosis.  Past Psychiatric History: see h&P Family History: History reviewed. No pertinent family history. Social History:  Social History   Substance and Sexual Activity  Alcohol Use Yes   Comment: Pt drinks beer and liquor     Social History   Substance and Sexual Activity  Drug Use No    Social History   Socioeconomic History   Marital status: Legally Separated    Spouse name: Not on file   Number of children: Not on file   Years of education: Not on file   Highest education level: Not on file  Occupational History   Not on file  Tobacco Use   Smoking status: Some Days    Types: Pipe   Smokeless tobacco: Never  Vaping Use   Vaping status: Every Day  Substance and Sexual Activity   Alcohol use: Yes    Comment: Pt drinks beer and liquor   Drug use: No   Sexual activity: Not on file  Other Topics Concern   Not on  file  Social History Narrative   Not on file   Social Drivers of Health   Financial Resource Strain: Not on file  Food Insecurity: No Food Insecurity (01/09/2024)   Hunger Vital Sign    Worried About Running Out of Food in the Last Year: Never true    Ran Out of Food in the Last Year: Never true  Transportation Needs: No Transportation Needs (01/09/2024)   PRAPARE - Administrator, Civil Service (Medical): No    Lack of Transportation (Non-Medical): No  Physical Activity: Not on file  Stress: Not on file  Social Connections: Not on file   Past Medical History: History reviewed. No pertinent past medical history.  Past Surgical History:  Procedure Laterality Date   NASAL FRACTURE SURGERY      Current Medications: Current Facility-Administered Medications  Medication Dose Route Frequency Provider Last Rate Last Admin   acetaminophen  (TYLENOL ) tablet 650 mg  650 mg Oral Q6H PRN Smith, Annie B, NP   650 mg at 01/09/24 2351   alum & mag hydroxide-simeth (MAALOX/MYLANTA) 200-200-20 MG/5ML suspension 30 mL  30 mL Oral Q4H PRN Smith, Annie B, NP       chlordiazePOXIDE  (LIBRIUM ) capsule 25 mg  25 mg Oral Q6H PRN Smith, Annie B, NP       haloperidol  (HALDOL ) tablet 5 mg  5 mg Oral TID PRN  Claudene Zelda NOVAK, NP       And   diphenhydrAMINE  (BENADRYL ) capsule 50 mg  50 mg Oral TID PRN Smith, Annie B, NP       haloperidol  lactate (HALDOL ) injection 5 mg  5 mg Intramuscular TID PRN Smith, Annie B, NP       And   diphenhydrAMINE  (BENADRYL ) injection 50 mg  50 mg Intramuscular TID PRN Smith, Annie B, NP       And   LORazepam  (ATIVAN ) injection 2 mg  2 mg Intramuscular TID PRN Smith, Annie B, NP       haloperidol  lactate (HALDOL ) injection 10 mg  10 mg Intramuscular TID PRN Smith, Annie B, NP       And   diphenhydrAMINE  (BENADRYL ) injection 50 mg  50 mg Intramuscular TID PRN Smith, Annie B, NP       And   LORazepam  (ATIVAN ) injection 2 mg  2 mg Intramuscular TID PRN Smith, Annie B,  NP       FLUoxetine  (PROZAC ) capsule 20 mg  20 mg Oral Daily Corwyn Vora E, PA-C   20 mg at 01/10/24 9191   folic acid  (FOLVITE ) tablet 1 mg  1 mg Oral Daily Smith, Annie B, NP   1 mg at 01/10/24 9190   hydrOXYzine  (ATARAX ) tablet 25 mg  25 mg Oral BID PRN Bettyjane Shenoy E, PA-C       hydrOXYzine  (ATARAX ) tablet 50 mg  50 mg Oral QHS Johniya Durfee E, PA-C       loperamide  (IMODIUM ) capsule 2-4 mg  2-4 mg Oral PRN Smith, Annie B, NP       magnesium  hydroxide (MILK OF MAGNESIA) suspension 30 mL  30 mL Oral Daily PRN Smith, Annie B, NP       multivitamin with minerals tablet 1 tablet  1 tablet Oral Daily Smith, Annie B, NP   1 tablet at 01/10/24 0809   ondansetron  (ZOFRAN -ODT) disintegrating tablet 4 mg  4 mg Oral Q6H PRN Smith, Annie B, NP       thiamine  (VITAMIN B1) tablet 100 mg  100 mg Oral Daily Smith, Annie B, NP   100 mg at 01/10/24 9190   Or   thiamine  (VITAMIN B1) injection 100 mg  100 mg Intravenous Daily Smith, Annie B, NP       traZODone  (DESYREL ) tablet 50 mg  50 mg Oral QHS PRN Smith, Annie B, NP   50 mg at 01/09/24 2123    Lab Results:  Results for orders placed or performed during the hospital encounter of 01/08/24 (from the past 48 hours)  TSH     Status: None   Collection Time: 01/09/24  6:16 AM  Result Value Ref Range   TSH 0.701 0.350 - 4.500 uIU/mL    Comment: Performed at [facility], [address] [place], [place] [address]  Hemoglobin A1c     Status: None   Collection Time: 01/09/24  6:16 AM  Result Value Ref Range   Hgb A1c MFr Bld 5.0 4.8 - 5.6 %    Comment: (NOTE) Diagnosis of Diabetes The following HbA1c ranges recommended by the American Diabetes Association (ADA) may be used as an aid in the diagnosis of diabetes mellitus.  Hemoglobin             Suggested A1C NGSP%              Diagnosis  <5.7  Non Diabetic  5.7-6.4                Pre-Diabetic  >6.4                   Diabetic  <7.0                    Glycemic control for                       adults with diabetes.     Mean Plasma Glucose 96.8 mg/dL    Comment: Performed at [facility], [address], [place], [place] [address]  Lipid panel     Status: Abnormal   Collection Time: 01/09/24  6:16 AM  Result Value Ref Range   Cholesterol 233 (H) 0 - 200 mg/dL    Comment:        ATP III CLASSIFICATION:  <200     mg/dL   Desirable  799-760  mg/dL   Borderline High  >=759    mg/dL   High           Triglycerides 90 <150 mg/dL   HDL 64 >59 mg/dL   Total CHOL/HDL Ratio 3.7 RATIO   VLDL 18 0 - 40 mg/dL   LDL Cholesterol 848 (H) 0 - 99 mg/dL    Comment:        Total Cholesterol/HDL:CHD Risk Coronary Heart Disease Risk Table                     Men   Women  1/2 Average Risk   3.4   3.3  Average Risk       5.0   4.4  2 X Average Risk   9.6   7.1  3 X Average Risk  23.4   11.0        Use the calculated Patient Ratio above and the CHD Risk Table to determine the patient's CHD Risk.        ATP III CLASSIFICATION (LDL):  <100     mg/dL   Optimal  899-870  mg/dL   Near or Above                    Optimal  130-159  mg/dL   Borderline  839-810  mg/dL   High  >809     mg/dL   Very High Performed at [facility], [address] [place], [place] [address]     Blood Alcohol level:  Lab Results  Component Value Date   [facility] <15 01/07/2024   ETH <15 12/31/2023    Metabolic Disorder Labs: Lab Results  Component Value Date   HGBA1C 5.0 01/09/2024   MPG 96.8 01/09/2024   No results found for: PROLACTIN Lab Results  Component Value Date   CHOL 233 (H) 01/09/2024   TRIG 90 01/09/2024   HDL 64 01/09/2024   CHOLHDL 3.7 01/09/2024   VLDL 18 01/09/2024   LDLCALC 151 (H) 01/09/2024    Physical Findings: AIMS:  , ,  ,  ,    CIWA:  CIWA-Ar Total: 0 COWS:      Psychiatric Specialty Exam:  Presentation  General Appearance:  Casual  Eye Contact: Fair  Speech: Clear and Coherent  Speech  Volume: Normal    Mood and Affect  Mood: Euthymic; Anxious  Affect: Congruent   Thought Process  Thought Processes: Coherent  Orientation:Full (Time, Place and Person)  Thought Content:Logical; WDL  Hallucinations:Hallucinations: None  Ideas of Reference:None  Suicidal Thoughts:Suicidal Thoughts: No  Homicidal Thoughts:Homicidal Thoughts: No   Sensorium  Memory: Immediate Fair; Recent Fair  Judgment: Good  Insight: Good   Executive Functions  Concentration: Good  Attention Span: Good  Recall: Good  Fund of Knowledge: Good  Language: Good   Psychomotor Activity  Psychomotor Activity: Psychomotor Activity: Normal  Musculoskeletal: Strength & Muscle Tone: within normal limits Gait & Station: normal Assets  Assets: Manufacturing Systems Engineer; Desire for Improvement; Housing; Vocational/Educational; Physical Health; Social Support; Health And Safety Inspector    Physical Exam: Physical Exam Vitals and nursing note reviewed.  HENT:     Head: Atraumatic.  Eyes:     Extraocular Movements: Extraocular movements intact.  Pulmonary:     Effort: Pulmonary effort is normal.  Neurological:     Mental Status: He is alert and oriented to person, place, and time.    Review of Systems  Psychiatric/Behavioral:  Positive for depression. Negative for hallucinations, substance abuse and suicidal ideas. The patient is nervous/anxious.    Blood pressure 128/80, pulse 67, temperature 98.1 F (36.7 C), temperature source Oral, resp. rate 16, height 5' 9 (1.753 m), weight 71 kg, SpO2 100%. Body mass index is 23.11 kg/m.  Diagnosis: Principal Problem:   GAD (generalized anxiety disorder)   PLAN: Safety and Monitoring:  -- Voluntary admission to inpatient psychiatric unit for safety, stabilization and treatment  -- Daily contact with patient to assess and evaluate symptoms and progress in treatment  -- Patient's case to be discussed in  multi-disciplinary team meeting  -- Observation Level : q15 minute checks  -- Vital signs:  q12 hours  -- Precautions: suicide, elopement, and assault -- Encouraged patient to participate in unit milieu and in scheduled group therapies  2. Psychiatric Treatment:  Scheduled Medications: Prozac  20 mg daily Hydroxyzine  50 mg nightly Hydroxyzine  25 mg as needed    -- The risks/benefits/side-effects/alternatives to this medication were discussed in detail with the patient and time was given for questions. The patient consents to medication trial.  3. Medical Issues Being Addressed:  No acute concerns   4. Discharge Planning:   -- Social work and case management to assist with discharge planning and identification of hospital follow-up needs prior to discharge  -- Estimated LOS: 3-4 days  Donnice FORBES Right, PA-C 01/10/2024, 3:04 PM

## 2024-01-10 NOTE — Plan of Care (Cosign Needed)

## 2024-01-10 NOTE — Group Note (Signed)
 Date:  01/10/2024 Time:  8:31 PM  Group Topic/Focus:  Making Healthy Choices:   The focus of this group is to help patients identify negative/unhealthy choices they were using prior to admission and identify positive/healthier coping strategies to replace them upon discharge. Self Care:   The focus of this group is to help patients understand the importance of self-care in order to improve or restore emotional, physical, spiritual, interpersonal, and financial health. Wrap-Up Group:   The focus of this group is to help patients review their daily goal of treatment and discuss progress on daily workbooks.    Participation Level:  Active  Participation Quality:  Appropriate  Affect:  Appropriate  Cognitive:  Alert, Appropriate, and Oriented  Insight: Appropriate and Good  Engagement in Group:  Engaged  Modes of Intervention:  Discussion and Support  Additional Comments:  N/A  Butler LITTIE Gelineau 01/10/2024, 8:31 PM

## 2024-01-11 MED ORDER — TRAZODONE HCL 50 MG PO TABS: 50.0000 mg | ORAL_TABLET | Freq: Every evening | ORAL | 0 refills | Status: AC | PRN

## 2024-01-11 MED ORDER — HYDROXYZINE HCL 50 MG PO TABS
50.0000 mg | ORAL_TABLET | Freq: Every day | ORAL | 0 refills | Status: AC
Start: 2024-01-11 — End: ?

## 2024-01-11 MED ORDER — FLUOXETINE HCL 20 MG PO CAPS: 20.0000 mg | ORAL_CAPSULE | Freq: Every day | ORAL | 0 refills | Status: AC

## 2024-01-11 NOTE — Plan of Care (Signed)
   Problem: Education: Goal: Knowledge of Anthony Frank General Education information/materials will improve Outcome: Progressing Goal: Emotional status will improve Outcome: Progressing Goal: Mental status will improve Outcome: Progressing Goal: Verbalization of understanding the information provided will improve Outcome: Progressing   Problem: Activity: Goal: Interest or engagement in activities will improve Outcome: Progressing Goal: Sleeping patterns will improve Outcome: Progressing   Problem: Coping: Goal: Ability to verbalize frustrations and anger appropriately will improve Outcome: Progressing Goal: Ability to demonstrate self-control will improve Outcome: Progressing

## 2024-01-11 NOTE — Progress Notes (Signed)
   01/10/24 2000  Psych Admission Type (Psych Patients Only)  Admission Status Voluntary  Psychosocial Assessment  Patient Complaints Sleep disturbance  Eye Contact Fair  Facial Expression Anxious  Affect Appropriate to circumstance  Speech Logical/coherent  Interaction Assertive  Motor Activity Other (Comment) (WDL)  Appearance/Hygiene Unremarkable  Behavior Characteristics Cooperative;Appropriate to situation  Mood Anxious;Pleasant  Thought Process  Coherency WDL  Content WDL  Delusions None reported or observed  Perception WDL  Hallucination None reported or observed  Judgment WDL  Confusion None  Danger to Self  Current suicidal ideation? Denies  Danger to Others  Danger to Others None reported or observed   Present anxious with concern to be able to sleep and rest his mind.  Pt reassured that night medications will aid sleeping.  Denied SI, AVH and depression.  Atarax  and Trazodone  given, effective.

## 2024-01-11 NOTE — BHH Suicide Risk Assessment (Signed)
 [facility] Discharge Suicide Risk Assessment   Principal Problem: GAD (generalized anxiety disorder) Discharge Diagnoses: Principal Problem:   GAD (generalized anxiety disorder)   Total Time spent with patient: 1 hour  Musculoskeletal: Strength & Muscle Tone: within normal limits Gait & Station: normal Patient leans: N/A  Psychiatric Specialty Exam  Presentation  General Appearance:  Casual  Eye Contact: Fair  Speech: Clear and Coherent  Speech Volume: Normal  Handedness:No data recorded  Mood and Affect  Mood: Euthymic  Duration of Depression Symptoms: No data recorded Affect: Congruent   Thought Process  Thought Processes: Coherent  Descriptions of Associations:Intact  Orientation:Full (Time, Place and Person)  Thought Content:WDL; Logical  History of Schizophrenia/Schizoaffective disorder:No data recorded Duration of Psychotic Symptoms:No data recorded Hallucinations:Hallucinations: None  Ideas of Reference:None  Suicidal Thoughts:Suicidal Thoughts: No  Homicidal Thoughts:Homicidal Thoughts: No   Sensorium  Memory: Immediate Fair; Recent Fair  Judgment: Good  Insight: Good   Executive Functions  Concentration: Good  Attention Span: Good  Recall: Good  Fund of Knowledge: Good  Language: Good   Psychomotor Activity  Psychomotor Activity: Psychomotor Activity: Normal   Assets  Assets: Communication Skills; Desire for Improvement; Financial Resources/Insurance; Housing; Physical Health; Resilience; Social Support; Vocational/Educational; Transportation   Sleep  Sleep: Sleep: Good  Estimated Sleeping Duration (Last 24 Hours): 5.25-7.25 hours (Due to Daylight Saving Time, the durations displayed may not accurately represent documentation during the time change interval)  Physical Exam: Physical Exam Vitals and nursing note reviewed.  HENT:     Head: Atraumatic.  Eyes:     Extraocular Movements: Extraocular  movements intact.  Pulmonary:     Effort: Pulmonary effort is normal.  Neurological:     Mental Status: He is alert and oriented to person, place, and time.  Psychiatric:        Mood and Affect: Mood normal.        Behavior: Behavior normal.        Thought Content: Thought content normal.        Judgment: Judgment normal.    Review of Systems  Psychiatric/Behavioral:  Negative for depression, hallucinations, memory loss, substance abuse and suicidal ideas. The patient is nervous/anxious. The patient does not have insomnia.    Blood pressure 130/82, pulse (!) 59, temperature 98.4 F (36.9 C), resp. rate 20, height 5' 9 (1.753 m), weight 71 kg, SpO2 100%. Body mass index is 23.11 kg/m.  Mental Status Per Nursing Assessment::   On Admission:  NA (NO SI)  Demographic Factors:  Male  Loss Factors: NA  Historical Factors: NA  Risk Reduction Factors:   Employed, Positive social support, and Positive coping skills or problem solving skills  Continued Clinical Symptoms:  Anxiety   Cognitive Features That Contribute To Risk:  None    Suicide Risk:  Minimal: No identifiable suicidal ideation.  Patients presenting with no risk factors but with morbid ruminations; may be classified as minimal risk based on the severity of the depressive symptoms   Follow-up Information     [facility] Follow up.   Why: In person assessment for therapy and psychiatry is 01/13/24 at 8 AM. Contact information: [address] [place] [address] [phone]                 Plan Of Care/Follow-up recommendations:  # It is recommended to the patient to continue psychiatric medications as prescribed, after discharge from the hospital.   # It is recommended to the patient to follow up  with your outpatient psychiatric provider and PCP. # It was discussed with the patient, the impact of alcohol, drugs, tobacco have been there overall psychiatric and medical wellbeing,  and total abstinence from substance use was recommended. # Prescriptions provided or sent directly to preferred pharmacy at discharge. Patient agreeable to plan. Given the opportunity to ask questions. Appears to feel comfortable with discharge.  # In the event of worsening symptoms, the patient is instructed to call the crisis hotline (988), 911 and or go to the nearest ED for appropriate evaluation and treatment of symptoms. To follow-up with primary care provider for other medical issues, concerns and or health care needs # Patient was discharged home as requested with a plan to follow up as noted above.    Donnice FORBES Right, PA-C 01/11/2024, 10:53 AM

## 2024-01-11 NOTE — Progress Notes (Signed)
  [facility] Adult Case Management Discharge Plan :  Will you be returning to the same living situation after discharge:  Yes,  Patient to return home.  At discharge, do you have transportation home?: Yes,  Patient's family to provide transportation.  Do you have the ability to pay for your medications: Yes,  [facility]  Release of information consent forms completed and in the chart;  Patient's signature needed at discharge.  Patient to Follow up at:  Follow-up Information     [facility] Follow up.   Why: Clinic Hours:  Monday - Friday: 8:00 a.m. - 5:00 p.m.   Walk-In / Same-Day Access: Walk-in assessments are available Monday, Wednesday, and Friday from 8:00 a.m. - 3:30 p.m.   You do not need an appointment during these times -- new patients may come in for same-day assessment and service connection.   After-Hours / Crisis Services:  If you experience a mental health or substance use crisis outside normal business hours, you may go to:  [facility]  [address] [place], [place] [address]  Open 24 hours a day, 7 days a week for behavioral health emergencies.   If you feel unsafe, are having thoughts of self-harm, or are in danger, call 911 or go to the nearest emergency department.  You can also reach the 988 Suicide & Crisis Lifeline by calling or texting 988 any time. Contact information: [address] [place] [address] [phone]                 Next level of care provider has access to [facility] Link:no  Safety Planning and Suicide Prevention discussed: Yes,  Lillianna Juradeo, [phone], Wife     Has patient been referred to the Quitline?: Patient does not use tobacco/nicotine products  Patient has been referred for addiction treatment: Yes, the patient will follow up with an outpatient provider for substance use disorder. Psychiatrist/APP: patient to schedule appointment  and Therapist: patient to schedule appointment  Alveta CHRISTELLA Kerns, LCSW 01/11/2024, 8:35 AM

## 2024-01-11 NOTE — Discharge Summary (Signed)
 Physician Discharge Summary Note  Patient:  Anthony Frank is an 48 y.o., male MRN:  [id number] DOB:  [birth year] Patient phone:  [phone] (home)  Patient address:   [address] [place] [address],   Total time spent: 40 min Date of Admission:  01/08/2024 Date of Discharge: 01/11/24  Reason for Admission:  anxiety  Principal Problem: GAD (generalized anxiety disorder) Discharge Diagnoses: Principal Problem:   GAD (generalized anxiety disorder)   Past Psychiatric History: see h&p  Family Psychiatric  History: see h&p Social History:  Social History   Substance and Sexual Activity  Alcohol Use Yes   Comment: Pt drinks beer and liquor     Social History   Substance and Sexual Activity  Drug Use No    Social History   Socioeconomic History   Marital status: Legally Separated    Spouse name: Not on file   Number of children: Not on file   Years of education: Not on file   Highest education level: Not on file  Occupational History   Not on file  Tobacco Use   Smoking status: Some Days    Types: Pipe   Smokeless tobacco: Never  Vaping Use   Vaping status: Every Day  Substance and Sexual Activity   Alcohol use: Yes    Comment: Pt drinks beer and liquor   Drug use: No   Sexual activity: Not on file  Other Topics Concern   Not on file  Social History Narrative   Not on file   Social Drivers of Health   Financial Resource Strain: Not on file  Food Insecurity: No Food Insecurity (01/09/2024)   Hunger Vital Sign    Worried About Running Out of Food in the Last Year: Never true    Ran Out of Food in the Last Year: Never true  Transportation Needs: No Transportation Needs (01/09/2024)   PRAPARE - Administrator, Civil Service (Medical): No    Lack of Transportation (Non-Medical): No  Physical Activity: Not on file  Stress: Not on file  Social Connections: Not on file   Past Medical History: History reviewed. No pertinent past  medical history.  Past Surgical History:  Procedure Laterality Date   NASAL FRACTURE SURGERY     Family History: History reviewed. No pertinent family history.  Hospital Course:     Patient was admitted for worsening anxiety and sleep disturbance. They were admitted voluntarily. They denied SI/HI/AVH on admission. They were started on prozac  20 mg, hydroxyzine  prn later transitioned to hydroxyzine  50 mg at bedtime, and trazodone  50 mg prn at bedtime.Medication risk, benefits, and potential interactions were reviewed with patient he verbalized understanding and consented to trial. They tolerated medications without adverse effect. They participated in the unit milieu. They consistently denied SI/HI/AVH. They were able to discuss social support, crisis resources, and coping mechanisms.   On the day of discharge, patient denies SI/HI/AVH. They are linear on exam, organized, and goal-directed. Affect is bright and they are optimistic to be going home. They express gratitude to staff for the support they received throughout the admission. They report they were able to sleep last night and anxiety is greatly improved compared to admission. They demonstrate good insight into the need for outpatient follow-up and continued medication compliance. They were proactive and scheduled their own follow-up appointment with [facility] for Tuesday at 8 AM.  SW spoke with wife on the day of discharge. According to wife, there are no current safety concerns.  She reports that there are no weapons in the home and that she feels safe with patient returning home. She reports that her son will transport patient at discharge. She also notes that the patient "sounds better."  Detailed risk assessment is complete based on clinical exam and individual risk factors, and acute suicide risk is low and acute violence risk is low.  Currently, all modifiable risk of harm to self or others have been addressed. Patient is no longer  appropriate for the acute inpatient setting and is able to continue treatment for mental health needs in the community with the supports as indicated below. Patient is educated and verbalized understanding of discharge plan of care including medications, follow-up appointments, mental health resources, and further crisis services in the community. They are instructed to call 911 or present to the nearest emergency room should they experience any decompensation in mood, or return of suicidal/homicidal ideation. Patient verbalizes understanding of this education and agrees to this plan of care.  Physical Findings: AIMS:  , ,  ,  ,    CIWA:  CIWA-Ar Total: 0 COWS:      Psychiatric Specialty Exam:  Presentation  General Appearance:  Casual  Eye Contact: Fair  Speech: Clear and Coherent  Speech Volume: Normal    Mood and Affect  Mood: Euthymic  Affect: Congruent   Thought Process  Thought Processes: Coherent  Descriptions of Associations:Intact  Orientation:Full (Time, Place and Person)  Thought Content:WDL; Logical  Hallucinations:Hallucinations: None  Ideas of Reference:None  Suicidal Thoughts:Suicidal Thoughts: No  Homicidal Thoughts:Homicidal Thoughts: No   Sensorium  Memory: Immediate Fair; Recent Fair  Judgment: Good  Insight: Good   Executive Functions  Concentration: Good  Attention Span: Good  Recall: Good  Fund of Knowledge: Good  Language: Good   Psychomotor Activity  Psychomotor Activity: Psychomotor Activity: Normal  Musculoskeletal: Strength & Muscle Tone: within normal limits Gait & Station: normal Assets  Assets: Manufacturing Systems Engineer; Desire for Improvement; Financial Resources/Insurance; Housing; Physical Health; Resilience; Social Support; Vocational/Educational; Transportation   Sleep  Sleep: Sleep: Good    Physical Exam: Physical Exam Vitals and nursing note reviewed.  HENT:     Head: Atraumatic.   Eyes:     Extraocular Movements: Extraocular movements intact.  Pulmonary:     Effort: Pulmonary effort is normal.  Neurological:     Mental Status: He is alert and oriented to person, place, and time.  Psychiatric:        Mood and Affect: Mood normal.        Behavior: Behavior normal.        Thought Content: Thought content normal.        Judgment: Judgment normal.    Review of Systems  Psychiatric/Behavioral:  Negative for depression, hallucinations, memory loss, substance abuse and suicidal ideas. The patient is nervous/anxious. The patient does not have insomnia.    Blood pressure 130/82, pulse (!) 59, temperature 98.4 F (36.9 C), resp. rate 20, height 5' 9 (1.753 m), weight 71 kg, SpO2 100%. Body mass index is 23.11 kg/m.   Social History   Tobacco Use  Smoking Status Some Days   Types: Pipe  Smokeless Tobacco Never   Tobacco Cessation:  A prescription for an FDA-approved tobacco cessation medication was offered at discharge and the patient refused   Blood Alcohol level:  Lab Results  Component Value Date   [facility] <15 01/07/2024   [facility] <15 12/31/2023    Metabolic Disorder Labs:  Lab Results  Component Value  Date   HGBA1C 5.0 01/09/2024   MPG 96.8 01/09/2024   No results found for: PROLACTIN Lab Results  Component Value Date   CHOL 233 (H) 01/09/2024   TRIG 90 01/09/2024   HDL 64 01/09/2024   CHOLHDL 3.7 01/09/2024   VLDL 18 01/09/2024   LDLCALC 151 (H) 01/09/2024    See Psychiatric Specialty Exam and Suicide Risk Assessment completed by Attending Physician prior to discharge.  Discharge destination:  Home  Is patient on multiple antipsychotic therapies at discharge:  No   Has Patient had three or more failed trials of antipsychotic monotherapy by history:  No  Recommended Plan for Multiple Antipsychotic Therapies: NA   Allergies as of 01/11/2024   No Known Allergies      Medication List     STOP taking these medications     chlordiazePOXIDE  25 MG capsule Commonly known as: LIBRIUM    multivitamin with minerals Tabs tablet       TAKE these medications      Indication  FLUoxetine  20 MG capsule Commonly known as: PROZAC  Take 1 capsule (20 mg total) by mouth daily. Start taking on: January 12, 2024  Indication: Generalized Anxiety Disorder   hydrOXYzine  50 MG tablet Commonly known as: ATARAX  Take 1 tablet (50 mg total) by mouth at bedtime.  Indication: Feeling Anxious, Feeling Tense   traZODone  50 MG tablet Commonly known as: DESYREL  Take 1 tablet (50 mg total) by mouth at bedtime as needed for sleep.  Indication: Trouble Sleeping        Follow-up Information     [facility] Follow up.   Why: In person assessment for therapy and psychiatry is 01/13/24 at 8 AM. Contact information: [address] [place] [address] [phone]                 Follow-up recommendations:   # It is recommended to the patient to continue psychiatric medications as prescribed, after discharge from the hospital.   # It is recommended to the patient to follow up with your outpatient psychiatric provider and PCP. # It was discussed with the patient, the impact of alcohol, drugs, tobacco have been there overall psychiatric and medical wellbeing, and total abstinence from substance use was recommended. # Prescriptions provided or sent directly to preferred pharmacy at discharge. Patient agreeable to plan. Given the opportunity to ask questions. Appears to feel comfortable with discharge.  # In the event of worsening symptoms, the patient is instructed to call the crisis hotline (988), 911 and or go to the nearest ED for appropriate evaluation and treatment of symptoms. To follow-up with primary care provider for other medical issues, concerns and or health care needs # Patient was discharged home as requested with a plan to follow up as noted above.      Signed: Donnice FORBES Right, PA-C 01/11/2024, 4:18 PM

## 2024-01-11 NOTE — [facility] Suicide Risk Assessment (Addendum)
 [facility] INPATIENT:  Family/Significant Other Suicide Prevention Education  Suicide Prevention Education:  Education Completed; Lillianna Juradeo, [phone], wife has been identified by the patient as the family member/significant other with whom the patient will be residing, and identified as the person(s) who will aid the patient in the event of a mental health crisis (suicidal ideations/suicide attempt).  With written consent from the patient, the family member/significant other has been provided the following suicide prevention education, prior to the and/or following the discharge of the patient.  The suicide prevention education provided includes the following: Suicide risk factors Suicide prevention and interventions National Suicide Hotline telephone number [facility] assessment telephone number [facility] Emergency Assistance 911 [facility] and/or Residential Mobile Crisis Unit telephone number  Request made of family/significant other to: Remove weapons (e.g., guns, rifles, knives), all items previously/currently identified as safety concern.   Remove drugs/medications (over-the-counter, prescriptions, illicit drugs), all items previously/currently identified as a safety concern.  The family member/significant other verbalizes understanding of the suicide prevention education information provided.  The family member/significant other agrees to remove the items of safety concern listed above.  According to wife, there are no current safety concerns. Wife reported that there are no weapons at the home and she feels safe with the patient returning to the home. Wife reported that her son will provide transportation at discharge. Wife reported that the patient "sounds better."    Alveta CHRISTELLA Kerns 01/11/2024, 8:56 AM

## 2024-01-11 NOTE — Progress Notes (Signed)
 Patient pleasant and cooperative on approach. Denies SI,HI and AVH. Verbalized understanding discharge instructions,prescriptions and follow up care.  All belongings returned from [employer]. Suicide safety plan filled by patient and placed in chart. Copy given to patient.Patient escorted out by staff and transported by family.
